# Patient Record
Sex: Female | Born: 1965 | Race: Black or African American | Hispanic: No | Marital: Single | Smoking: Never smoker
Health system: Southern US, Community
[De-identification: ages and names within clinical notes are randomized; demographics above are authoritative.]

## PROBLEM LIST (undated history)

## (undated) DIAGNOSIS — M329 Systemic lupus erythematosus, unspecified: Secondary | ICD-10-CM

## (undated) DIAGNOSIS — I10 Essential (primary) hypertension: Secondary | ICD-10-CM

## (undated) HISTORY — PX: UTERINE FIBROID SURGERY: SHX826

## (undated) HISTORY — DX: Essential (primary) hypertension: I10

## (undated) HISTORY — DX: Systemic lupus erythematosus, unspecified: M32.9

---

## 2001-10-17 ENCOUNTER — Encounter: Payer: Self-pay | Admitting: *Deleted

## 2001-10-17 ENCOUNTER — Emergency Department (HOSPITAL_COMMUNITY): Admission: EM | Admit: 2001-10-17 | Discharge: 2001-10-17 | Payer: Self-pay | Admitting: *Deleted

## 2003-04-16 ENCOUNTER — Inpatient Hospital Stay (HOSPITAL_COMMUNITY): Admission: AD | Admit: 2003-04-16 | Discharge: 2003-04-16 | Payer: Self-pay | Admitting: Obstetrics and Gynecology

## 2003-05-17 ENCOUNTER — Other Ambulatory Visit: Admission: RE | Admit: 2003-05-17 | Discharge: 2003-05-17 | Payer: Self-pay | Admitting: Obstetrics and Gynecology

## 2003-05-17 ENCOUNTER — Encounter: Admission: RE | Admit: 2003-05-17 | Discharge: 2003-05-17 | Payer: Self-pay | Admitting: Obstetrics and Gynecology

## 2003-06-18 ENCOUNTER — Ambulatory Visit (HOSPITAL_COMMUNITY): Admission: RE | Admit: 2003-06-18 | Discharge: 2003-06-18 | Payer: Self-pay | Admitting: Obstetrics and Gynecology

## 2003-07-03 ENCOUNTER — Encounter: Admission: RE | Admit: 2003-07-03 | Discharge: 2003-07-03 | Payer: Self-pay | Admitting: Obstetrics and Gynecology

## 2004-02-06 IMAGING — US US TRANSVAGINAL NON-OB
1 series · 13 of 25 positions shown · non-contrast
Comparison: none

CLINICAL DATA: Passage of large clot with abnormal vaginal bleeding.
PELVIC ULTRASOUND WITH TRANSVAGINAL EVALUATION, 04/16/03
Multiple images of the uterus and adnexa were obtained using a transabdominal and endovaginal approaches.  
The uterus has a maximal sagittal length of 10.7 cm and a maximal AP width of 6.0 cm.  A homogeneous uterine myometrium is seen.  The endometrial canal endovaginally measures 1.3 cm in maximal AP width.  The rim appears thin and echogenic with the internal portion of the endometrial canal being hypoechoic in nature, and I suspect that the endometrial lining itself may be normal with the canal filled with some blood, giving this distended endometrial appearance.  Evaluation of the endometrial canal is somewhat suboptimal due to the AP positioning of the uterus.  Once the patient is not actively bleeding, further assessment can be undertaken; and if necessary sonohysterography can be performed to evaluate the endometrial lining better. 
The right ovary measures 3.4 x 1.7 x 2.9 cm and has a normal appearance.   The left ovary measures 6.8 x 4.8 x 3.4 cm and contains two unilocular cysts measuring 3.6 x 3.0 x 3.8 cm and a second probable follicular cyst measuring 1.7 x 1.6 x 1.4 cm.   No cul-de-sac or periovarian fluid is seen.
IMPRESSION 
Normal uterine myometrium.
Somewhat suboptimal evaluation of the endometrial canal is possible due to the uterine positioning.  The study suggests a thin endometrial lining with a distended endometrial cavity possibly containing diffuse blood products.  Reassessment of the endometrial canal can be undertaken after the patient is no longer bleeding for hopeful improvement assessment of the endometrial lining.
Simple left ovarian cyst.  Follow-up is recommended in 4 to 6 weeks for reassessment of this suspected benign process.

[Series 1: unknown · 0.32mm/px · 13 of 73 slices shown]
[im 1/73]
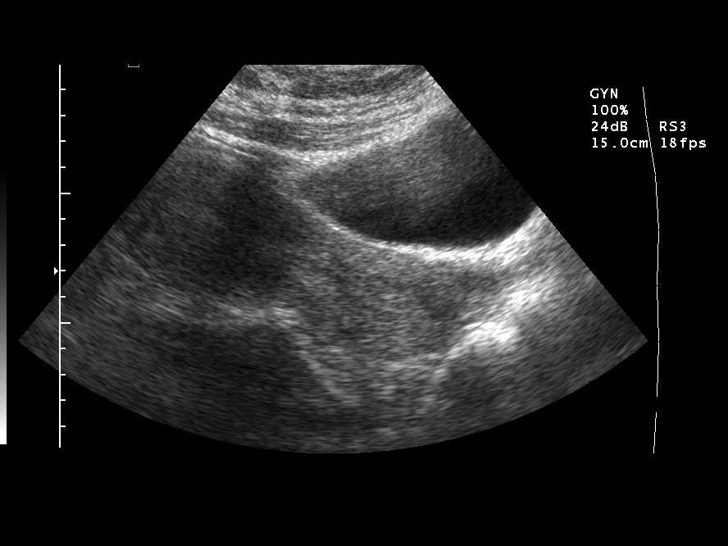
[im 7/73]
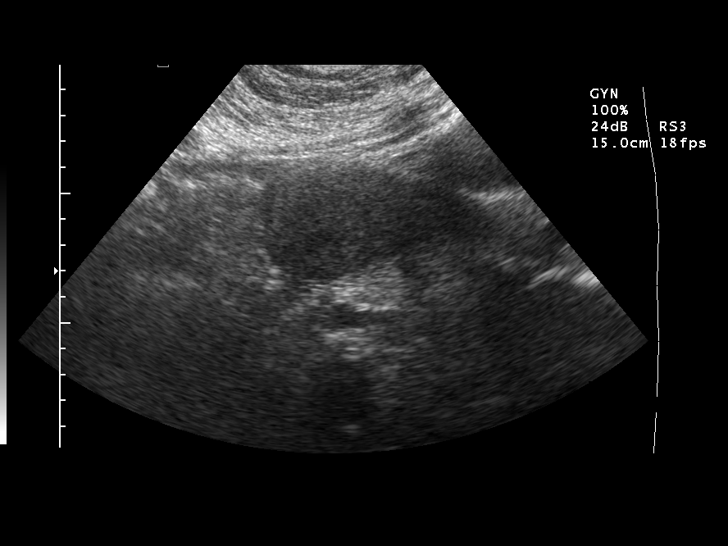
[im 13/73]
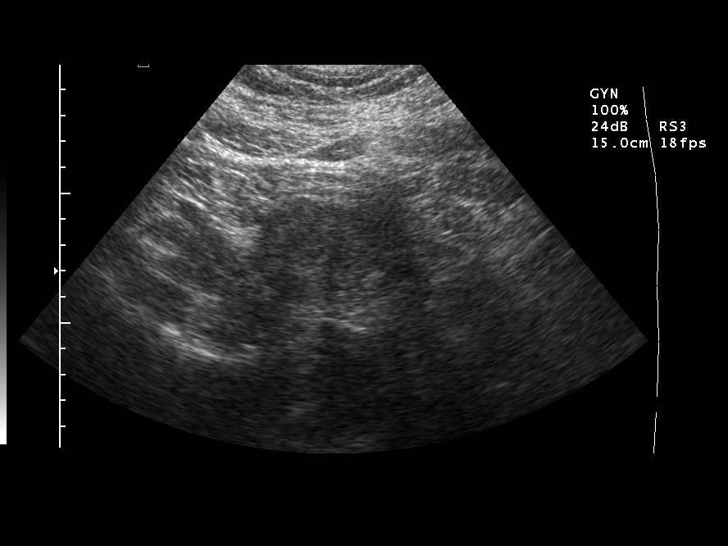
[im 19/73]
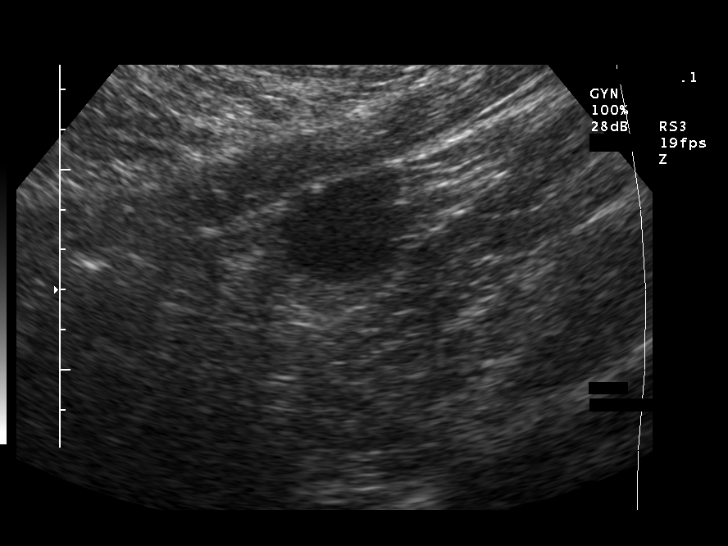
[im 25/73]
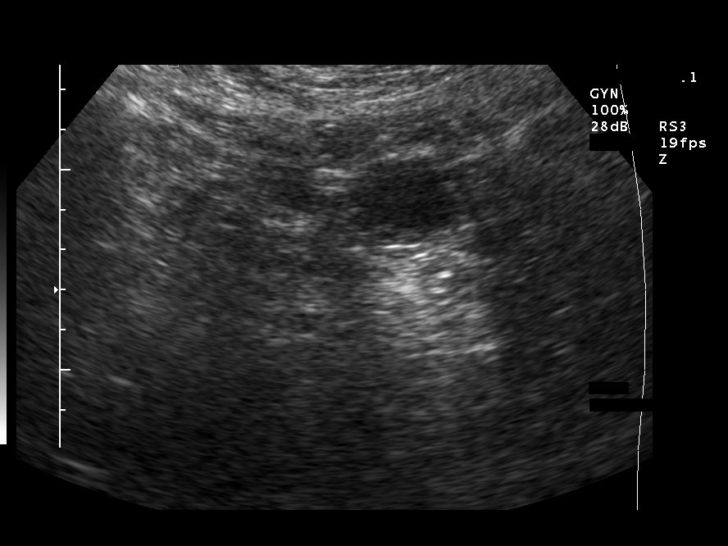
[im 31/73]
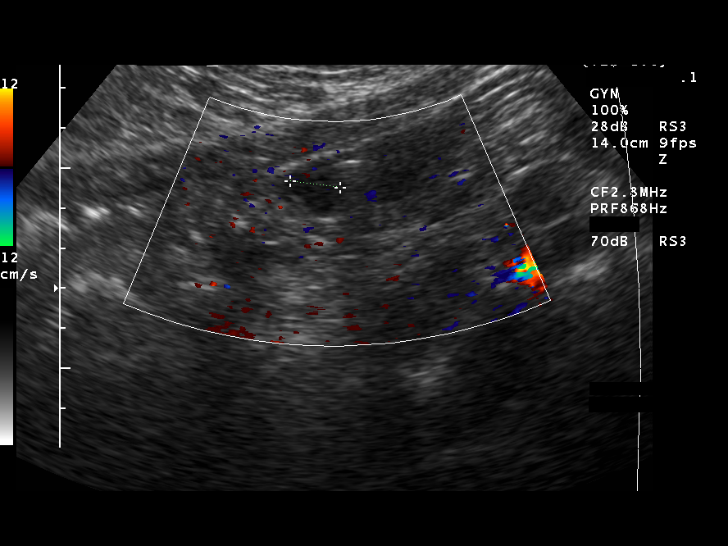
[im 37/73]
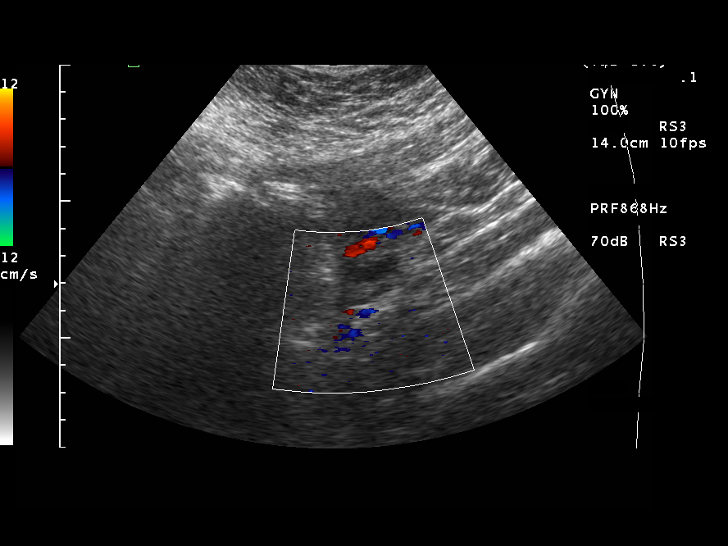
[im 43/73]
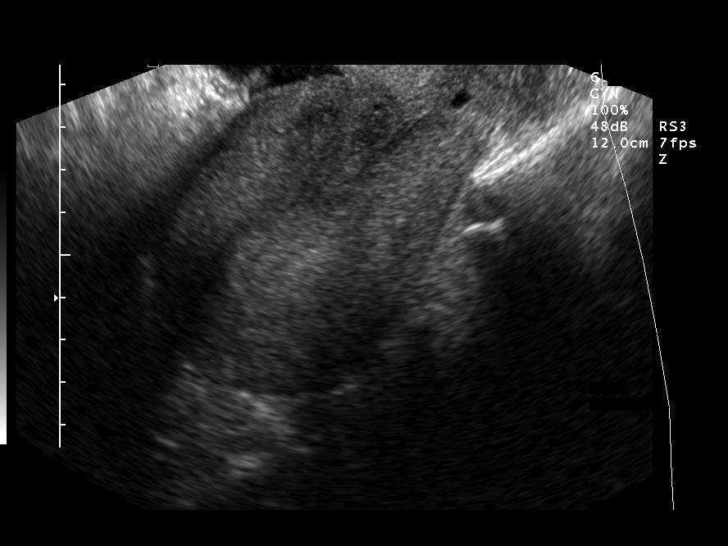
[im 49/73]
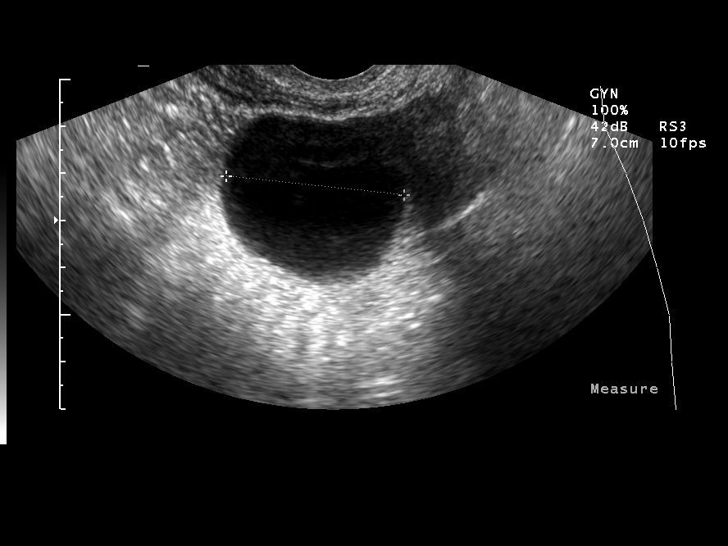
[im 55/73]
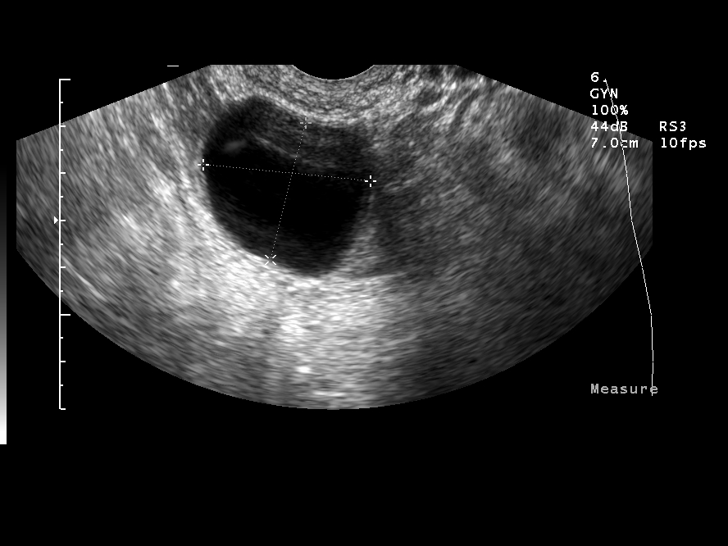
[im 61/73]
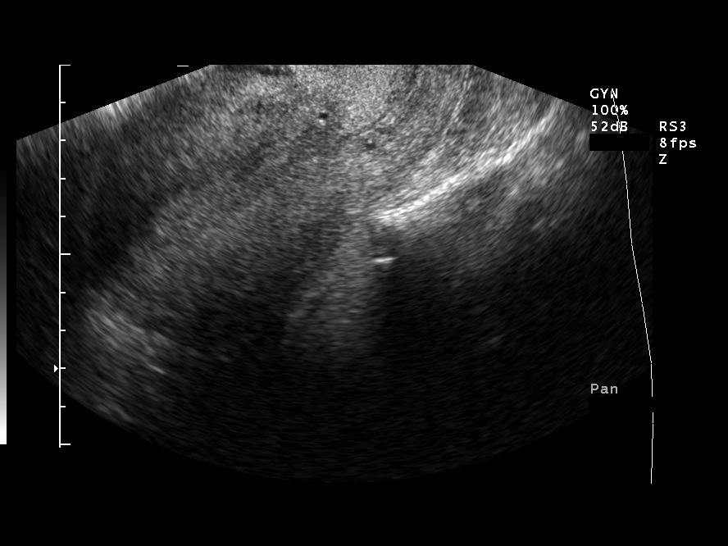
[im 67/73]
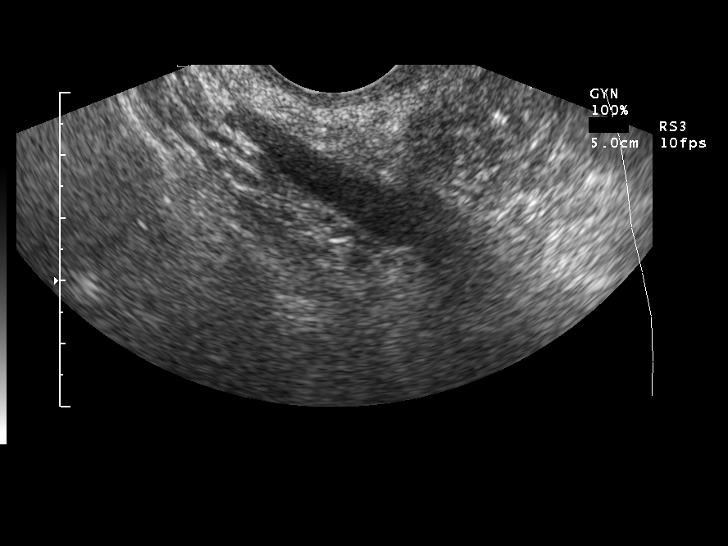
[im 73/73]
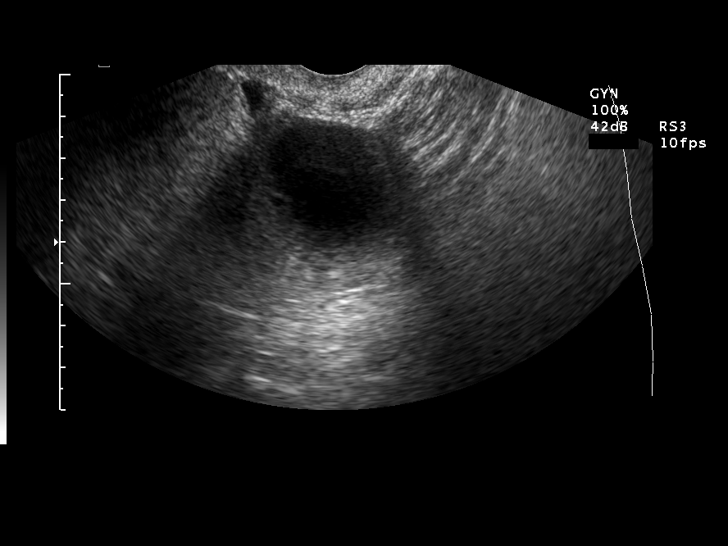

[13 of 25 positions shown; findings below may reference images not displayed]

## 2008-06-08 DIAGNOSIS — M329 Systemic lupus erythematosus, unspecified: Secondary | ICD-10-CM

## 2008-06-08 HISTORY — DX: Systemic lupus erythematosus, unspecified: M32.9

## 2011-08-02 ENCOUNTER — Ambulatory Visit: Payer: Self-pay | Admitting: Family Medicine

## 2011-08-02 DIAGNOSIS — I10 Essential (primary) hypertension: Secondary | ICD-10-CM

## 2011-08-02 DIAGNOSIS — R079 Chest pain, unspecified: Secondary | ICD-10-CM

## 2011-08-02 DIAGNOSIS — M329 Systemic lupus erythematosus, unspecified: Secondary | ICD-10-CM | POA: Insufficient documentation

## 2011-08-02 DIAGNOSIS — Z79899 Other long term (current) drug therapy: Secondary | ICD-10-CM

## 2011-08-02 DIAGNOSIS — IMO0002 Reserved for concepts with insufficient information to code with codable children: Secondary | ICD-10-CM

## 2011-08-02 DIAGNOSIS — E669 Obesity, unspecified: Secondary | ICD-10-CM

## 2011-08-02 LAB — POCT CBC
Granulocyte percent: 38.4 %G (ref 37–80)
HCT, POC: 36.9 % — AB (ref 37.7–47.9)
Hemoglobin: 11.7 g/dL — AB (ref 12.2–16.2)
Lymph, poc: 1.7 (ref 0.6–3.4)
MCH, POC: 29.4 pg (ref 27–31.2)
MCHC: 31.7 g/dL — AB (ref 31.8–35.4)
MCV: 92.7 fL (ref 80–97)
MID (cbc): 0.2 (ref 0–0.9)
MPV: 9.6 fL (ref 0–99.8)
POC Granulocyte: 1.2 — AB (ref 2–6.9)
POC LYMPH PERCENT: 54.8 %L — AB (ref 10–50)
POC MID %: 6.8 %M (ref 0–12)
Platelet Count, POC: 257 10*3/uL (ref 142–424)
RBC: 3.98 M/uL — AB (ref 4.04–5.48)
RDW, POC: 13.8 %
WBC: 3.1 10*3/uL — AB (ref 4.6–10.2)

## 2011-08-02 LAB — COMPREHENSIVE METABOLIC PANEL
ALT: 11 U/L (ref 0–35)
AST: 15 U/L (ref 0–37)
Albumin: 4.1 g/dL (ref 3.5–5.2)
Alkaline Phosphatase: 48 U/L (ref 39–117)
BUN: 14 mg/dL (ref 6–23)
CO2: 27 mEq/L (ref 19–32)
Calcium: 9.4 mg/dL (ref 8.4–10.5)
Chloride: 104 mEq/L (ref 96–112)
Creat: 0.92 mg/dL (ref 0.50–1.10)
Glucose, Bld: 87 mg/dL (ref 70–99)
Potassium: 4 mEq/L (ref 3.5–5.3)
Sodium: 140 mEq/L (ref 135–145)
Total Bilirubin: 0.4 mg/dL (ref 0.3–1.2)
Total Protein: 6.6 g/dL (ref 6.0–8.3)

## 2011-08-02 LAB — POCT SEDIMENTATION RATE: POCT SED RATE: 29 mm/hr — AB (ref 0–22)

## 2011-08-02 MED ORDER — AMLODIPINE BESYLATE 10 MG PO TABS: 10.0000 mg | ORAL_TABLET | Freq: Every day | ORAL | Status: DC

## 2011-08-02 NOTE — Progress Notes (Signed)
Patient Name: Kristie Henderson Date of Birth: 1965/09/18 Medical Record Number: [id number] Gender: female Date of Encounter: 08/02/2011  History of Present Illness:  Kristie Henderson is a 46 y.o. very pleasant female patient who presents with the following:  Pt of Dr. Corliss Skains who manages her lupus- she is on plaquenil.  Here today for labs.    Per patient her BP is usually between 140 -150/ 70- 90.  She takes both her BP pills in the am.  Added norvasc the last time she was seen at [facility] due to poor BP control No headaches.  However when questioned about CP she notes that she has been having a "charley horse" in her anterior substernal chest for the last 2 weeks, occurs every few days and lasts about 5 minutes. Last happened last week.  Can occur at rest or with activity.  She has a history of HTN and obesity, but has never smoked.  She does have a family history of CAD.  She did have a stress test done for concern of "angina" which was negative- but his was more than 20 years ago.   Patient Active Problem List  Diagnoses  . Lupus  . Hypertension  . Obesity   History reviewed. No pertinent past medical history. Past Surgical History  Procedure Date  . Cesarean section   . Uterine fibroid surgery    History  Substance Use Topics  . Smoking status: Never Smoker   . Smokeless tobacco: Never Used  . Alcohol Use: No   Family History  Problem Relation Age of Onset  . Stroke Maternal Grandmother   . Heart disease Maternal Grandfather   . Cancer Paternal Grandfather    No Known Allergies  Medication list has been reviewed and updated.  Review of Systems: As per HPI- otherwise feels ok.    Physical Examination: Filed Vitals:   08/02/11 0816  BP: 150/87  Pulse: 66  Temp: 98.3 F (36.8 C)  TempSrc: Oral  Resp: 16  Height: 5' 7.25" (1.708 m)  Weight: 275 lb 9.6 oz (125.011 kg)    Body mass index is 42.84 kg/(m^2).  GEN: WDWN, NAD, Non-toxic, A & O x 3, obese HEENT:  Atraumatic, Normocephalic. Neck supple. No masses, No LAD. Ears and Nose: No external deformity. CV: RRR, No M/G/R. No JVD. No thrill. No extra heart sounds. PULM: CTA B, no wheezes, crackles, rhonchi. No retractions. No resp. distress. No accessory muscle use. ABD: S, NT, ND, +BS. No rebound. No HSM. EXTR: No c/c/e NEURO Normal gait.  PSYCH: Normally interactive. Conversant. Not depressed or anxious appearing.  Calm demeanor.   Results for orders placed in visit on 08/02/11  POCT CBC      Component Value Range   WBC 3.1 (*) 4.6 - 10.2 (K/uL)   Lymph, poc 1.7  0.6 - 3.4    POC LYMPH PERCENT 54.8 (*) 10 - 50 (%L)   MID (cbc) 0.2  0 - 0.9    POC MID % 6.8  0 - 12 (%M)   POC Granulocyte 1.2 (*) 2 - 6.9    Granulocyte percent 38.4  37 - 80 (%G)   RBC 3.98 (*) 4.04 - 5.48 (M/uL)   Hemoglobin 11.7 (*) 12.2 - 16.2 (g/dL)   HCT, POC 40.9 (*) 81.1 - 47.9 (%)   MCV 92.7  80 - 97 (fL)   MCH, POC 29.4  27 - 31.2 (pg)   MCHC 31.7 (*) 31.8 - 35.4 (g/dL)   RDW, POC  13.8     Platelet Count, POC 257  142 - 424 (K/uL)   MPV 9.6  0 - 99.8 (fL)  POCT SEDIMENTATION RATE      Component Value Range   POCT SED RATE 29 (*) 0 - 22 (mm/hr)     EKG: NSR, no ST elevation or depression Assessment and Plan: 1. Lupus  POCT CBC, POCT SEDIMENTATION RATE, ANA, Comprehensive metabolic panel, C3 and C4  2. Hypertension  amLODipine (NORVASC) 10 MG tablet  3. Obesity    4. Chest pain      Labs done as ordered by Dr. Eustace Quail.  Discussed CP in detail with patient.  Although her EKG is reassuring, this does not rule out the possibility of anginal pain- she has risk factors for CAD and the pain she is having may be a warning sign which allows [place] to intercept a problem before it leads to serious consequences such as an MI.  encouraged her to have a cardiology evaluation.  However, at this time Ms. Orris does not feel that she has the time or money to see cardiology- I did discuss the possibility of having a heart  attack if she is having angina.  She declined to see cardiology now, but did agree to start a baby asa daily, and to seek care if this problem continues.    Also increased norvasc to 10mg  daily today due to still poorly controlled BP.  Further follow- up pending additional labs

## 2011-08-03 LAB — ANA: Anti Nuclear Antibody(ANA): POSITIVE — AB

## 2011-08-03 LAB — ANTI-NUCLEAR AB-TITER (ANA TITER): ANA Titer 1: NEGATIVE

## 2011-08-04 ENCOUNTER — Encounter: Payer: Self-pay | Admitting: Family Medicine

## 2011-08-04 LAB — C3 AND C4
C3 Complement: 188 mg/dL — ABNORMAL HIGH (ref 90–180)
C4 Complement: 42 mg/dL — ABNORMAL HIGH (ref 10–40)

## 2011-08-05 ENCOUNTER — Encounter: Payer: Self-pay | Admitting: Family Medicine

## 2011-09-19 ENCOUNTER — Ambulatory Visit: Payer: Self-pay | Admitting: Family Medicine

## 2011-09-19 VITALS — BP 110/73 | HR 78 | Temp 98.3°F | Resp 16 | Ht 67.0 in | Wt 275.0 lb

## 2011-09-19 DIAGNOSIS — E785 Hyperlipidemia, unspecified: Secondary | ICD-10-CM

## 2011-09-19 DIAGNOSIS — M329 Systemic lupus erythematosus, unspecified: Secondary | ICD-10-CM

## 2011-09-19 DIAGNOSIS — D72819 Decreased white blood cell count, unspecified: Secondary | ICD-10-CM

## 2011-09-19 LAB — POCT CBC
Granulocyte percent: 54.9 %G (ref 37–80)
HCT, POC: 35.9 % — AB (ref 37.7–47.9)
Hemoglobin: 11.5 g/dL — AB (ref 12.2–16.2)
Lymph, poc: 2 (ref 0.6–3.4)
MCH, POC: 29.6 pg (ref 27–31.2)
MCHC: 32 g/dL (ref 31.8–35.4)
MCV: 92.6 fL (ref 80–97)
MID (cbc): 0.4 (ref 0–0.9)
MPV: 8.7 fL (ref 0–99.8)
POC Granulocyte: 3 (ref 2–6.9)
POC LYMPH PERCENT: 37.2 %L (ref 10–50)
POC MID %: 7.9 %M (ref 0–12)
Platelet Count, POC: 309 10*3/uL (ref 142–424)
RBC: 3.88 M/uL — AB (ref 4.04–5.48)
RDW, POC: 13.5 %
WBC: 5.5 10*3/uL (ref 4.6–10.2)

## 2011-09-19 NOTE — Progress Notes (Signed)
  Subjective:    Patient ID: Kristie Henderson, female    DOB: 08-May-1966, 45 y.o.   MRN: [id number]  HPI  Patient presents with request for CBC; presents order from Dr. Corliss Skains  Recent leukopenia; denies fever or other issues related to systemic illness  Also needs handicap parking tag paper work completed.  Provides previous paperwork completed at Dr. Fatima Sanger office.  Review of Systems     Objective:   Physical Exam  Constitutional: She appears well-developed and well-nourished.  Neck: Neck supple. No thyromegaly present.  Cardiovascular: Normal rate, regular rhythm and normal heart sounds.   Pulmonary/Chest: Effort normal and breath sounds normal.  Abdominal: Soft. Bowel sounds are normal.  Neurological: She is alert.  Skin:       Rare ecchymosis on LE     Results for orders placed in visit on 09/19/11  POCT CBC      Component Value Range   WBC 5.5  4.6 - 10.2 (K/uL)   Lymph, poc 2.0  0.6 - 3.4    POC LYMPH PERCENT 37.2  10 - 50 (%L)   MID (cbc) 0.4  0 - 0.9    POC MID % 7.9  0 - 12 (%M)   POC Granulocyte 3.0  2 - 6.9    Granulocyte percent 54.9  37 - 80 (%G)   RBC 3.88 (*) 4.04 - 5.48 (M/uL)   Hemoglobin 11.5 (*) 12.2 - 16.2 (g/dL)   HCT, POC 53.6 (*) 64.4 - 47.9 (%)   MCV 92.6  80 - 97 (fL)   MCH, POC 29.6  27 - 31.2 (pg)   MCHC 32.0  31.8 - 35.4 (g/dL)   RDW, POC 03.4     Platelet Count, POC 309  142 - 424 (K/uL)   MPV 8.7  0 - 99.8 (fL)        Assessment & Plan:   1. SLE (systemic lupus erythematosus)    2. Leucopenia  POCT CBC    Paperwork for handicap parking tag completed

## 2011-11-14 ENCOUNTER — Telehealth: Payer: Self-pay

## 2011-11-14 NOTE — Telephone Encounter (Signed)
I see form is completed already and in p/up drawer.  Darl Pikes already notified patient.

## 2011-11-14 NOTE — Telephone Encounter (Signed)
Pt dropped off a form today that she had dropped off in April but we apparently lost it.  Can someone please complete the form and call her when ready.  She is in [place] today and could pick it up later today.  I would appreciate your help if we Can get it for her today.    Thanks  Darl Pikes

## 2011-12-25 ENCOUNTER — Telehealth: Payer: Self-pay

## 2011-12-25 NOTE — Telephone Encounter (Signed)
Lisa at Dr. Kandra Nicolas office called to request most recent labs on patient, specifically CBC results.  The patient is in the office now.  Please call [phone] with any questions.  The fax # for office is [phone].

## 2011-12-25 NOTE — Telephone Encounter (Signed)
Requested labs faxed from April visit via Epic to Dr Corliss Skains at [phone].

## 2012-01-30 ENCOUNTER — Ambulatory Visit: Payer: Self-pay | Admitting: Family Medicine

## 2012-01-30 VITALS — BP 139/83 | HR 80 | Temp 98.3°F | Resp 16 | Ht 67.5 in | Wt 280.0 lb

## 2012-01-30 DIAGNOSIS — I10 Essential (primary) hypertension: Secondary | ICD-10-CM

## 2012-01-30 DIAGNOSIS — R079 Chest pain, unspecified: Secondary | ICD-10-CM

## 2012-01-30 DIAGNOSIS — D72819 Decreased white blood cell count, unspecified: Secondary | ICD-10-CM

## 2012-01-30 DIAGNOSIS — M329 Systemic lupus erythematosus, unspecified: Secondary | ICD-10-CM

## 2012-01-30 LAB — COMPREHENSIVE METABOLIC PANEL
ALT: 14 U/L (ref 0–35)
AST: 16 U/L (ref 0–37)
Albumin: 4.1 g/dL (ref 3.5–5.2)
Alkaline Phosphatase: 48 U/L (ref 39–117)
BUN: 9 mg/dL (ref 6–23)
CO2: 31 mEq/L (ref 19–32)
Calcium: 9.4 mg/dL (ref 8.4–10.5)
Chloride: 101 mEq/L (ref 96–112)
Creat: 0.85 mg/dL (ref 0.50–1.10)
Glucose, Bld: 77 mg/dL (ref 70–99)
Potassium: 4.2 mEq/L (ref 3.5–5.3)
Sodium: 138 mEq/L (ref 135–145)
Total Bilirubin: 0.4 mg/dL (ref 0.3–1.2)
Total Protein: 7.2 g/dL (ref 6.0–8.3)

## 2012-01-30 LAB — CBC WITH DIFFERENTIAL/PLATELET
Basophils Absolute: 0 10*3/uL (ref 0.0–0.1)
Basophils Relative: 1 % (ref 0–1)
Eosinophils Absolute: 0.1 10*3/uL (ref 0.0–0.7)
Eosinophils Relative: 2 % (ref 0–5)
HCT: 35.2 % — ABNORMAL LOW (ref 36.0–46.0)
Hemoglobin: 12.1 g/dL (ref 12.0–15.0)
Lymphocytes Relative: 45 % (ref 12–46)
Lymphs Abs: 1.6 10*3/uL (ref 0.7–4.0)
MCH: 30 pg (ref 26.0–34.0)
MCHC: 34.4 g/dL (ref 30.0–36.0)
MCV: 87.1 fL (ref 78.0–100.0)
Monocytes Absolute: 0.3 10*3/uL (ref 0.1–1.0)
Monocytes Relative: 8 % (ref 3–12)
Neutro Abs: 1.6 10*3/uL — ABNORMAL LOW (ref 1.7–7.7)
Neutrophils Relative %: 44 % (ref 43–77)
Platelets: 324 10*3/uL (ref 150–400)
RBC: 4.04 MIL/uL (ref 3.87–5.11)
RDW: 13.6 % (ref 11.5–15.5)
WBC: 3.7 10*3/uL — ABNORMAL LOW (ref 4.0–10.5)

## 2012-01-30 LAB — POCT URINALYSIS DIPSTICK
Bilirubin, UA: NEGATIVE
Glucose, UA: NEGATIVE
Ketones, UA: NEGATIVE
Leukocytes, UA: NEGATIVE
Nitrite, UA: NEGATIVE
Protein, UA: 30
Spec Grav, UA: 1.025
Urobilinogen, UA: 0.2
pH, UA: 7

## 2012-01-30 MED ORDER — LOSARTAN POTASSIUM-HCTZ 100-12.5 MG PO TABS: 1.0000 | ORAL_TABLET | Freq: Every day | ORAL | Status: DC

## 2012-01-30 NOTE — Patient Instructions (Addendum)
1. Hypertension  Comprehensive metabolic panel, POCT urinalysis dipstick, losartan-hydrochlorothiazide (HYZAAR) 100-12.5 MG per tablet  2. SLE (systemic lupus erythematosus related syndrome)  CBC with Differential  3. Leukocytopenia  CBC with Differential  4. Chest pain  Ambulatory referral to Cardiology

## 2012-01-30 NOTE — Progress Notes (Signed)
Subjective:    Patient ID: Kristie Henderson, female    DOB: [birth year], 46 y.o.   MRN: [id number]  HPIThis 46 y.o. female presents for evaluation of the following:  1. HTN:  Six month follow-up for HTN.  Management at last visit included increasing dose of  Amlodipine to 10mg  daily.  No side effects to medication.  Home BP running 120/80-150/90.  +chest pain, no palpitations, no SOB, +leg swelling.  Interested in diuretic due to swelling.   2.  Chest pain: six month follow-up.   Still having chest pain once weekly; can occur at rest, with exertion, randomly happen.  Feels like elephant sitting on chest; mild SOB, no diaphoresis, +dizziness, no nausea, vomiting.  Duration 10 minutes.  Now agreeable to cardiology referral; started taking ASA daily after last visit.  3.  SLE:  Chronic since 19-Nov-2003.  Followed by Deveschwar.  Needs CBC, CMET.  Followed by Deveschwar every three months.  No change in medications other than 2 to 1 of Plaquenil due to low WBC; now back up to 2.  May warrant different medication due to persistent leukocytopenia.  PMH:  HTN age 54, SLE age 80, Irregular Menses. Psurg:  C-section x 1. twins All:  LATEX Social: divorced; dating; lives with son.  5 children (adopted 1, 32, 28, 23, 23, 21), 9 grandchildren.  Works Midwife.  No tobacco/ETOH/drugs.  Rare exercise.  Working on Education administrator at [employer].  Family:  M13-Jun-1974, seizure disorder.  F-died lung cancer age 61;    Sibling:  None.    Review of Systems  Constitutional: Negative for fever, chills and fatigue.  Respiratory: Positive for chest tightness. Negative for cough, shortness of breath and wheezing.   Cardiovascular: Positive for chest pain and leg swelling. Negative for palpitations.  Gastrointestinal: Negative for nausea and abdominal pain.  Neurological: Negative for dizziness, tremors, syncope, weakness, light-headedness, numbness and headaches.    No past medical history on file.  Past Surgical  History  Procedure Date  . Cesarean section   . Uterine fibroid surgery     Prior to Admission medications   Medication Sig Start Date End Date Taking? Authorizing Provider  amLODipine (NORVASC) 10 MG tablet Take 1 tablet (10 mg total) by mouth daily. 08/02/11 08/01/12 Yes Gwenlyn Found Copland, MD  bisoprolol-hydrochlorothiazide (ZIAC) 10-6.25 MG per tablet Take 1 tablet by mouth daily.   Yes Historical Provider, MD  hydroxychloroquine (PLAQUENIL) 200 MG tablet Take 200 mg by mouth 2 (two) times daily.   Yes Historical Provider, MD  ibuprofen (ADVIL,MOTRIN) 200 MG tablet Take 200 mg by mouth 2 (two) times daily.   Yes Historical Provider, MD  NAPROXEN PO Take by mouth.   Yes Historical Provider, MD  losartan-hydrochlorothiazide (HYZAAR) 100-12.5 MG per tablet Take 1 tablet by mouth daily. 01/30/12 01/29/13  Ethelda Chick, MD    No Known Allergies  History   Social History  . Marital Status: Single    Spouse Name: N/A    Number of Children: N/A  . Years of Education: N/A   Occupational History  . Not on file.   Social History Main Topics  . Smoking status: Never Smoker   . Smokeless tobacco: Never Used  . Alcohol Use: No  . Drug Use: No  . Sexually Active: Not on file   Other Topics Concern  . Not on file   Social History Narrative  . No narrative on file    Family History  Problem Relation Age of  Onset  . Stroke Maternal Grandmother   . Heart disease Maternal Grandfather   . Cancer Paternal Grandfather        Objective:   Physical Exam  Constitutional: She is oriented to person, place, and time. She appears well-developed and well-nourished. No distress.  HENT:  Head: Normocephalic and atraumatic.  Eyes: Conjunctivae are normal. Pupils are equal, round, and reactive to light.  Neck: Normal range of motion. Neck supple. No JVD present. No thyromegaly present.  Cardiovascular: Normal rate, regular rhythm, normal heart sounds and intact distal pulses.   No murmur  heard.      Trace pitting edema BLE.  Pulmonary/Chest: Effort normal and breath sounds normal. No respiratory distress. She has no wheezes. She has no rales. She exhibits no tenderness.  Abdominal: Soft. Bowel sounds are normal.  Lymphadenopathy:    She has no cervical adenopathy.  Neurological: She is alert and oriented to person, place, and time.  Skin: Skin is warm. Rash noted. She is not diaphoretic.  Psychiatric: She has a normal mood and affect. Her behavior is normal. Judgment and thought content normal.      Results for orders placed in visit on 01/30/12  POCT URINALYSIS DIPSTICK      Component Value Range   Color, UA yellow     Clarity, UA cloudy     Glucose, UA negative     Bilirubin, UA negative     Ketones, UA negative     Spec Grav, UA 1.025     Blood, UA large     pH, UA 7.0     Protein, UA 30     Urobilinogen, UA 0.2     Nitrite, UA negative     Leukocytes, UA Negative      Assessment & Plan:   1. Hypertension  Comprehensive metabolic panel, POCT urinalysis dipstick, losartan-hydrochlorothiazide (HYZAAR) 100-12.5 MG per tablet  2. SLE (systemic lupus erythematosus related syndrome)  CBC with Differential  3. Leukocytopenia  CBC with Differential  4. Chest pain  Ambulatory referral to Cardiology     1.  HTN: improved; will change Losartan 100mg  to Losartan 100/12.5 one daily.  Obtain labs.   2.  SLE:  Stable; followed by rheumatology every three months; obtain labs per rheumatology order. 3.  Leukocytopenia: persistent issue; repeat labs today.  May warrant switch of Plaquenil to different agent. 4.  Chest Pain: persistent.  Now agreeable to cardiology referral. Will make referral.  To ED for severe chest pain.

## 2012-02-03 NOTE — Progress Notes (Signed)
Reviewed and agree.

## 2012-07-09 ENCOUNTER — Other Ambulatory Visit: Payer: Self-pay | Admitting: Family Medicine

## 2012-08-06 ENCOUNTER — Ambulatory Visit: Payer: Self-pay | Admitting: Family Medicine

## 2012-08-06 VITALS — BP 158/105 | HR 91 | Temp 98.1°F | Resp 18 | Ht 67.0 in | Wt 285.0 lb

## 2012-08-06 DIAGNOSIS — M329 Systemic lupus erythematosus, unspecified: Secondary | ICD-10-CM

## 2012-08-06 DIAGNOSIS — I10 Essential (primary) hypertension: Secondary | ICD-10-CM

## 2012-08-06 LAB — POCT CBC
Granulocyte percent: 47.4 %G (ref 37–80)
HCT, POC: 34.8 % — AB (ref 37.7–47.9)
Hemoglobin: 10.9 g/dL — AB (ref 12.2–16.2)
Lymph, poc: 2.1 (ref 0.6–3.4)
MCH, POC: 27.9 pg (ref 27–31.2)
MCHC: 31.3 g/dL — AB (ref 31.8–35.4)
MCV: 89.3 fL (ref 80–97)
MID (cbc): 0.3 (ref 0–0.9)
MPV: 8.6 fL (ref 0–99.8)
POC Granulocyte: 2.2 (ref 2–6.9)
POC LYMPH PERCENT: 45.4 %L (ref 10–50)
POC MID %: 7.2 %M (ref 0–12)
Platelet Count, POC: 310 10*3/uL (ref 142–424)
RBC: 3.9 M/uL — AB (ref 4.04–5.48)
RDW, POC: 14.2 %
WBC: 4.6 10*3/uL (ref 4.6–10.2)

## 2012-08-06 LAB — POCT UA - MICROSCOPIC ONLY
Bacteria, U Microscopic: NEGATIVE
Casts, Ur, LPF, POC: NEGATIVE
Crystals, Ur, HPF, POC: NEGATIVE
Mucus, UA: NEGATIVE
RBC, urine, microscopic: NEGATIVE
Yeast, UA: NEGATIVE

## 2012-08-06 LAB — POCT URINALYSIS DIPSTICK
Bilirubin, UA: NEGATIVE
Blood, UA: NEGATIVE
Glucose, UA: NEGATIVE
Ketones, UA: NEGATIVE
Leukocytes, UA: NEGATIVE
Nitrite, UA: NEGATIVE
Protein, UA: NEGATIVE
Spec Grav, UA: 1.02
Urobilinogen, UA: 0.2
pH, UA: 8

## 2012-08-06 LAB — POCT SEDIMENTATION RATE: POCT SED RATE: 30 mm/hr — AB (ref 0–22)

## 2012-08-06 MED ORDER — LOSARTAN POTASSIUM-HCTZ 100-12.5 MG PO TABS: 1.0000 | ORAL_TABLET | Freq: Every day | ORAL | Status: DC

## 2012-08-06 MED ORDER — AMLODIPINE BESYLATE 10 MG PO TABS: 10.0000 mg | ORAL_TABLET | Freq: Every day | ORAL | Status: DC

## 2012-08-06 MED ORDER — BISOPROLOL-HYDROCHLOROTHIAZIDE 10-6.25 MG PO TABS: ORAL_TABLET | ORAL | Status: DC

## 2012-08-06 NOTE — Patient Instructions (Addendum)
Keep an eye on your blood pressure- it if does not come back to normal with the addition of your ziac medication please let us know. I will send a copy of your labs to you and to Dr. Algis Downs.

## 2012-08-06 NOTE — Progress Notes (Signed)
[facility] [address] [place] [address] [id number] [phone]  Date:  08/06/2012   Name:  Kristie Henderson   DOB:  [birth year]   MRN:  [id number]  PCP:  No primary provider on file.    Chief Complaint: Medication Refill   History of Present Illness:  Kristie Henderson is a 47 y.o. very pleasant female patient who presents with the following:  She is here for a medicaiton refill and also for labs per Dr Corliss Skains who treats her for lupus.   She is taking amlodipine, ziac and hyzaar for her BP.  She is out of her ziac- has been out for about one week.   She has been aware that her pressure is up- she has noted that she felt a little bit "woozy"   LMP was a few months ago- she had a fibroid surgery in 2008 and has had only occasional menses since then, she was told that she would be unlikely to get pregnant.    Patient Active Problem List  Diagnosis  . Lupus  . Hypertension  . Obesity    No past medical history on file.  Past Surgical History  Procedure Laterality Date  . Cesarean section    . Uterine fibroid surgery      History  Substance Use Topics  . Smoking status: Never Smoker   . Smokeless tobacco: Never Used  . Alcohol Use: No    Family History  Problem Relation Age of Onset  . Stroke Maternal Grandmother   . Heart disease Maternal Grandfather   . Cancer Paternal Grandfather     No Known Allergies  Medication list has been reviewed and updated.  Current Outpatient Prescriptions on File Prior to Visit  Medication Sig Dispense Refill  . bisoprolol-hydrochlorothiazide (ZIAC) 10-6.25 MG per tablet TAKE 1 TABLET BY MOUTH DAILY  90 tablet  3  . hydroxychloroquine (PLAQUENIL) 200 MG tablet Take 200 mg by mouth 2 (two) times daily.      Marland Kitchen losartan-hydrochlorothiazide (HYZAAR) 100-12.5 MG per tablet Take 1 tablet by mouth daily.  90 tablet  1  . NAPROXEN PO Take by mouth.      Marland Kitchen amLODipine (NORVASC) 10 MG tablet Take 1 tablet (10 mg total) by  mouth daily.  90 tablet  3  . ibuprofen (ADVIL,MOTRIN) 200 MG tablet Take 200 mg by mouth 2 (two) times daily.       No current facility-administered medications on file prior to visit.    Review of Systems:  As per HPI- otherwise negative.   Physical Examination: Filed Vitals:   08/06/12 1220  BP: 158/105  Pulse: 91  Temp: 98.1 F (36.7 C)  Resp: 18   Filed Vitals:   08/06/12 1220  Height: 5\' 7"  (1.702 m)  Weight: 285 lb (129.275 kg)   Body mass index is 44.63 kg/(m^2). Ideal Body Weight: Weight in (lb) to have BMI = 25: 159.3  GEN: WDWN, NAD, Non-toxic, A & O x 3, obese HEENT: Atraumatic, Normocephalic. Neck supple. No masses, No LAD.  PEERL, EOMI Ears and Nose: No external deformity. CV: RRR, No M/G/R. No JVD. No thrill. No extra heart sounds. PULM: CTA B, no wheezes, crackles, rhonchi. No retractions. No resp. distress. No accessory muscle use. EXTR: No c/c/e NEURO Normal gait.  PSYCH: Normally interactive. Conversant. Not depressed or anxious appearing.  Calm demeanor.    Assessment and Plan: Hypertension - Plan: amLODipine (NORVASC) 10 MG tablet, bisoprolol-hydrochlorothiazide (ZIAC) 10-6.25 MG per  tablet, losartan-hydrochlorothiazide (HYZAAR) 100-12.5 MG per tablet  Lupus - Plan: ANA, POCT SEDIMENTATION RATE, POCT UA - Microscopic Only, POCT urinalysis dipstick, POCT CBC  Refilled HTN medications today. Did labs per rheumatology and a CBC Will plan further follow- up pending labs.  See patient instructions for more details.     Lanaiya Lantry, MD  Send copy of labs to pt and to Dr. Corliss Skains

## 2012-08-08 LAB — ANA: Anti Nuclear Antibody(ANA): NEGATIVE

## 2012-08-09 ENCOUNTER — Encounter: Payer: Self-pay | Admitting: Family Medicine

## 2012-10-15 ENCOUNTER — Other Ambulatory Visit: Payer: Self-pay | Admitting: Physician Assistant

## 2012-10-15 ENCOUNTER — Ambulatory Visit: Payer: Self-pay | Admitting: Family Medicine

## 2012-10-15 VITALS — BP 126/90 | HR 77 | Temp 98.2°F | Resp 18 | Wt 287.0 lb

## 2012-10-15 DIAGNOSIS — M25569 Pain in unspecified knee: Secondary | ICD-10-CM

## 2012-10-15 DIAGNOSIS — M2550 Pain in unspecified joint: Secondary | ICD-10-CM

## 2012-10-15 DIAGNOSIS — M329 Systemic lupus erythematosus, unspecified: Secondary | ICD-10-CM

## 2012-10-15 DIAGNOSIS — Z79899 Other long term (current) drug therapy: Secondary | ICD-10-CM

## 2012-10-15 LAB — POCT SEDIMENTATION RATE: POCT SED RATE: 23 mm/hr — AB (ref 0–22)

## 2012-10-15 LAB — TSH: TSH: 1.548 u[IU]/mL (ref 0.350–4.500)

## 2012-10-15 LAB — T4, FREE: Free T4: 1 ng/dL (ref 0.80–1.80)

## 2012-10-15 NOTE — Progress Notes (Signed)
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 She recently had a flare requiring 6 months of prednisone during which time she 36 pounds. Now she is off the prednisone and is feeling fine.  Objective: Mild facial erythema on cheeks, no acute distress, obese HEENT: Unremarkable Neck: Supple no adenopathy Chest: Clear Heart: Regular no murmur Extremities: Trace edema Skin: Other than the plethoric cheeks, there is no rash  Assessment: Lupus, quiet and at the moment  Plan: Lab testing per Dr. Corliss Skains: Pain in joint, multiple sites - Plan: Vitamin D, 25-hydroxy, C3 and C4  Lupus (systemic lupus erythematosus) - Plan: C3 and C4, Systemic Lupus Profile B, TSH, T4, Free, POCT SEDIMENTATION RATE  High risk medication use - Plan: Vitamin D, 25-hydroxy, TSH, T4, Free    Signed,  Elvina Sidle, MD

## 2012-10-17 LAB — EXTRACTABLE NUCLEAR ANTIGEN ANTIBODY
ENA SM Ab Ser-aCnc: 1 AU/mL (ref <30)
SM/RNP: <1 AU/mL (ref <30)
SSA (Ro) (ENA) Antibody, IgG: 2 AU/mL (ref <30)
SSB (La) (ENA) Antibody, IgG: <1 AU/mL (ref <30)
Scleroderma (Scl-70) (ENA) Antibody, IgG: 42 AU/mL — ABNORMAL HIGH (ref <30)
ds DNA Ab: 4 IU/mL (ref <30)

## 2012-10-17 LAB — C3 AND C4
C3 Complement: 198 mg/dL — ABNORMAL HIGH (ref 90–180)
C4 Complement: 57 mg/dL — ABNORMAL HIGH (ref 10–40)

## 2012-10-17 LAB — VITAMIN D 25 HYDROXY (VIT D DEFICIENCY, FRACTURES): Vit D, 25-Hydroxy: <10 ng/mL — ABNORMAL LOW (ref 30–89)

## 2012-10-18 ENCOUNTER — Other Ambulatory Visit: Payer: Self-pay | Admitting: Family Medicine

## 2012-10-18 DIAGNOSIS — E559 Vitamin D deficiency, unspecified: Secondary | ICD-10-CM

## 2012-10-18 MED ORDER — CHOLECALCIFEROL 1.25 MG (50000 UT) PO TABS: 1.0000 | ORAL_TABLET | ORAL | Status: AC

## 2012-10-24 ENCOUNTER — Telehealth: Payer: Self-pay

## 2012-10-24 MED ORDER — ERGOCALCIFEROL 1.25 MG (50000 UT) PO CAPS: 50000.0000 [IU] | ORAL_CAPSULE | ORAL | Status: AC

## 2012-10-24 NOTE — Telephone Encounter (Signed)
Pharmacy called to clarify Rx sent 10/18/12. The Rx ordered does not contain Vit D and is for the wrong # for twice a week dosing. Checked notes under Labs and Dr L did intend to order Vit D. I will send in new Rx for this w/correct #.

## 2013-03-02 DIAGNOSIS — M329 Systemic lupus erythematosus, unspecified: Secondary | ICD-10-CM

## 2013-03-11 ENCOUNTER — Ambulatory Visit: Payer: Self-pay | Admitting: Physician Assistant

## 2013-03-11 VITALS — BP 132/78 | HR 82 | Temp 98.5°F | Resp 18 | Ht 67.0 in | Wt 280.2 lb

## 2013-03-11 DIAGNOSIS — M2550 Pain in unspecified joint: Secondary | ICD-10-CM

## 2013-03-11 DIAGNOSIS — R252 Cramp and spasm: Secondary | ICD-10-CM

## 2013-03-11 DIAGNOSIS — R5381 Other malaise: Secondary | ICD-10-CM

## 2013-03-11 DIAGNOSIS — Z79899 Other long term (current) drug therapy: Secondary | ICD-10-CM

## 2013-03-11 LAB — COMPREHENSIVE METABOLIC PANEL
ALT: 14 U/L (ref 0–35)
AST: 15 U/L (ref 0–37)
Albumin: 3.6 g/dL (ref 3.5–5.2)
Alkaline Phosphatase: 45 U/L (ref 39–117)
BUN: 9 mg/dL (ref 6–23)
CO2: 28 mEq/L (ref 19–32)
Calcium: 9.2 mg/dL (ref 8.4–10.5)
Chloride: 103 mEq/L (ref 96–112)
Creat: 0.89 mg/dL (ref 0.50–1.10)
Glucose, Bld: 102 mg/dL — ABNORMAL HIGH (ref 70–99)
Potassium: 3.9 mEq/L (ref 3.5–5.3)
Sodium: 139 mEq/L (ref 135–145)
Total Bilirubin: 0.6 mg/dL (ref 0.3–1.2)
Total Protein: 6.5 g/dL (ref 6.0–8.3)

## 2013-03-11 LAB — CBC WITH DIFFERENTIAL/PLATELET
Basophils Absolute: 0 10*3/uL (ref 0.0–0.1)
Basophils Relative: 1 % (ref 0–1)
Eosinophils Absolute: 0.1 10*3/uL (ref 0.0–0.7)
Eosinophils Relative: 2 % (ref 0–5)
HCT: 31.5 % — ABNORMAL LOW (ref 36.0–46.0)
Hemoglobin: 10.5 g/dL — ABNORMAL LOW (ref 12.0–15.0)
Lymphocytes Relative: 43 % (ref 12–46)
Lymphs Abs: 1.4 10*3/uL (ref 0.7–4.0)
MCH: 28.4 pg (ref 26.0–34.0)
MCHC: 33.3 g/dL (ref 30.0–36.0)
MCV: 85.1 fL (ref 78.0–100.0)
Monocytes Absolute: 0.2 10*3/uL (ref 0.1–1.0)
Monocytes Relative: 6 % (ref 3–12)
Neutro Abs: 1.6 10*3/uL — ABNORMAL LOW (ref 1.7–7.7)
Neutrophils Relative %: 48 % (ref 43–77)
Platelets: 326 10*3/uL (ref 150–400)
RBC: 3.7 MIL/uL — ABNORMAL LOW (ref 3.87–5.11)
RDW: 15.2 % (ref 11.5–15.5)
WBC: 3.3 10*3/uL — ABNORMAL LOW (ref 4.0–10.5)

## 2013-03-11 LAB — PHOSPHORUS: Phosphorus: 2.9 mg/dL (ref 2.3–4.6)

## 2013-03-11 LAB — MAGNESIUM: Magnesium: 1.7 mg/dL (ref 1.5–2.5)

## 2013-03-11 LAB — SEDIMENTATION RATE: Sed Rate: 18 mm/hr (ref 0–22)

## 2013-03-11 NOTE — Progress Notes (Signed)
  Subjective:    Patient ID: Kristie Henderson, female    DOB: 05-01-1966, 47 y.o.   MRN: [id number]  HPI 47 year old female presents for labwork.  She sees Dr. Corliss Skains regularly who has provided orders for labs today.  She lives in [place], [place] and drives here q32months for recheck with her.  Comes here for labs since Dr. Milus Glazier is her PCP.   Patient is otherwise doing well with no other concerns today.     Review of Systems  Constitutional: Negative for fever and chills.  Musculoskeletal: Positive for myalgias and arthralgias.  Neurological: Negative for dizziness and headaches.       Objective:   Physical Exam  Constitutional: She is oriented to person, place, and time. She appears well-developed and well-nourished.  HENT:  Head: Normocephalic and atraumatic.  Right Ear: External ear normal.  Left Ear: External ear normal.  Eyes: Conjunctivae are normal.  Neck: Normal range of motion.  Cardiovascular: Normal rate, regular rhythm and normal heart sounds.   Pulmonary/Chest: Effort normal and breath sounds normal.  Neurological: She is alert and oriented to person, place, and time.  Psychiatric: She has a normal mood and affect. Her behavior is normal. Judgment and thought content normal.          Assessment & Plan:  Other malaise and fatigue - Plan: Comprehensive metabolic panel  Pain in joint, multiple sites - Plan: ANA, CK total and CKMB, Sedimentation rate  Encounter for long-term (current) use of other medications - Plan: CBC with Differential  Cramp of limb - Plan: Magnesium, Phosphorus  Labs pending Will fax to Dr. Corliss Skains Follow up as needed.

## 2013-03-12 LAB — CK TOTAL AND CKMB (NOT AT ARMC)
CK, MB: 0.9 ng/mL (ref 0.3–4.0)
Relative Index: 0.8 (ref 0.0–2.5)
Total CK: 109 U/L (ref 7–177)

## 2013-03-13 LAB — ANA: Anti Nuclear Antibody(ANA): NEGATIVE

## 2013-08-12 ENCOUNTER — Ambulatory Visit: Payer: Self-pay | Admitting: Family Medicine

## 2013-08-12 ENCOUNTER — Other Ambulatory Visit: Payer: Self-pay | Admitting: Family Medicine

## 2013-08-12 VITALS — BP 132/84 | HR 93 | Temp 98.3°F | Resp 17 | Ht 68.0 in | Wt 277.0 lb

## 2013-08-12 DIAGNOSIS — I10 Essential (primary) hypertension: Secondary | ICD-10-CM

## 2013-08-12 DIAGNOSIS — E559 Vitamin D deficiency, unspecified: Secondary | ICD-10-CM

## 2013-08-12 MED ORDER — LOSARTAN POTASSIUM-HCTZ 100-12.5 MG PO TABS: 1.0000 | ORAL_TABLET | Freq: Every day | ORAL | Status: DC

## 2013-08-12 MED ORDER — AMLODIPINE BESYLATE 10 MG PO TABS: 10.0000 mg | ORAL_TABLET | Freq: Every day | ORAL | Status: DC

## 2013-08-12 MED ORDER — BISOPROLOL-HYDROCHLOROTHIAZIDE 10-6.25 MG PO TABS: ORAL_TABLET | ORAL | Status: DC

## 2013-08-12 NOTE — Progress Notes (Signed)
Subjective: Patient is here for refill of her blood pressure medicines. She is doing well with no major complaints. No headaches or dizziness. Rarely gets dizziness. No chest pains or palpitations. She does see a cardiologist apparently. She has a history of lupus and considers Dr. Elbert EwingsL. to be her doctor.  Objective: Neck supple without nodes or thyromegaly. Chest clear. Heart regular without murmurs.  Assessment: Hypertension, out of her medicines  Plan: Prescribed medications with one years worth of refills. Did inform her that she should see Dr. Elbert EwingsL. back in about 6 months.

## 2013-08-12 NOTE — Patient Instructions (Signed)
Continue current blood pressure medication. Advised recheck with Dr. Milus GlazierLauenstein sometime this fall, in about September.  Return if problems

## 2013-10-21 ENCOUNTER — Ambulatory Visit: Payer: Self-pay | Admitting: Internal Medicine

## 2013-10-21 VITALS — BP 128/80 | HR 87 | Temp 98.1°F | Ht 66.0 in | Wt 281.8 lb

## 2013-10-21 DIAGNOSIS — M2550 Pain in unspecified joint: Secondary | ICD-10-CM

## 2013-10-21 DIAGNOSIS — R300 Dysuria: Secondary | ICD-10-CM

## 2013-10-21 DIAGNOSIS — Z79899 Other long term (current) drug therapy: Secondary | ICD-10-CM

## 2013-10-21 LAB — COMPREHENSIVE METABOLIC PANEL
ALT: 10 U/L (ref 0–35)
AST: 14 U/L (ref 0–37)
Albumin: 4 g/dL (ref 3.5–5.2)
Alkaline Phosphatase: 55 U/L (ref 39–117)
BUN: 10 mg/dL (ref 6–23)
CO2: 26 mEq/L (ref 19–32)
Calcium: 9.4 mg/dL (ref 8.4–10.5)
Chloride: 101 mEq/L (ref 96–112)
Creat: 0.95 mg/dL (ref 0.50–1.10)
Glucose, Bld: 79 mg/dL (ref 70–99)
Potassium: 4 mEq/L (ref 3.5–5.3)
Sodium: 137 mEq/L (ref 135–145)
Total Bilirubin: 0.4 mg/dL (ref 0.2–1.2)
Total Protein: 7.1 g/dL (ref 6.0–8.3)

## 2013-10-21 LAB — POCT URINALYSIS DIPSTICK
Bilirubin, UA: NEGATIVE
Glucose, UA: NEGATIVE
Ketones, UA: NEGATIVE
Leukocytes, UA: NEGATIVE
Nitrite, UA: NEGATIVE
Protein, UA: NEGATIVE
Spec Grav, UA: 1.02
Urobilinogen, UA: 0.2
pH, UA: 8

## 2013-10-21 LAB — POCT CBC
Granulocyte percent: 51.8 %G (ref 37–80)
HCT, POC: 38.9 % (ref 37.7–47.9)
HEMOGLOBIN: 12.2 g/dL (ref 12.2–16.2)
Lymph, poc: 1.7 (ref 0.6–3.4)
MCH: 29.2 pg (ref 27–31.2)
MCHC: 31.4 g/dL — AB (ref 31.8–35.4)
MCV: 93 fL (ref 80–97)
MID (cbc): 0.3 (ref 0–0.9)
MPV: 8.7 fL (ref 0–99.8)
POC Granulocyte: 2.1 (ref 2–6.9)
POC LYMPH PERCENT: 40.7 %L (ref 10–50)
POC MID %: 7.5 %M (ref 0–12)
Platelet Count, POC: 311 10*3/uL (ref 142–424)
RBC: 4.18 M/uL (ref 4.04–5.48)
RDW, POC: 15.5 %
WBC: 4.1 10*3/uL — AB (ref 4.6–10.2)

## 2013-10-21 LAB — POCT UA - MICROSCOPIC ONLY
Casts, Ur, LPF, POC: NEGATIVE
Crystals, Ur, HPF, POC: NEGATIVE
RBC, URINE, MICROSCOPIC: NEGATIVE
Yeast, UA: NEGATIVE

## 2013-10-21 LAB — POCT SEDIMENTATION RATE: POCT SED RATE: 31 mm/hr — AB (ref 0–22)

## 2013-10-21 NOTE — Progress Notes (Signed)
   Subjective:    Patient ID: Kathrin GreathouseBarbara A Hansman, female    DOB: 04/25/1966, 48 y.o.   MRN: [id number]  HPI  48 year old AA female [facility] for lab work ordered by Dr.S. Corliss Skainseveshwar with [facility], to evaluate for her lupus.  Pt lives in [place] and can only come on Saturday to get lab work completed.   Pt states symptoms of aches, pains, migraines & headaches are constant.  Pt describes pain as "flu x 10" with all over body aches and pains.  Pt describes pain including headaches are at level of 8/10 and she "just gets used to it."   Review of Systems     Objective:   Physical Exam        Assessment & Plan:

## 2013-10-23 LAB — ANTI-DNA ANTIBODY, DOUBLE-STRANDED

## 2013-10-23 LAB — ANA: Anti Nuclear Antibody(ANA): NEGATIVE

## 2013-11-24 ENCOUNTER — Encounter: Payer: Self-pay | Admitting: Family Medicine

## 2014-09-15 ENCOUNTER — Ambulatory Visit (INDEPENDENT_AMBULATORY_CARE_PROVIDER_SITE_OTHER): Payer: Self-pay | Admitting: Family Medicine

## 2014-09-15 VITALS — BP 138/90 | HR 97 | Temp 97.5°F | Resp 18 | Ht 67.0 in | Wt 289.6 lb

## 2014-09-15 DIAGNOSIS — M329 Systemic lupus erythematosus, unspecified: Secondary | ICD-10-CM

## 2014-09-15 DIAGNOSIS — IMO0002 Reserved for concepts with insufficient information to code with codable children: Secondary | ICD-10-CM

## 2014-09-15 DIAGNOSIS — E669 Obesity, unspecified: Secondary | ICD-10-CM

## 2014-09-15 DIAGNOSIS — I10 Essential (primary) hypertension: Secondary | ICD-10-CM

## 2014-09-15 MED ORDER — AMLODIPINE BESYLATE 10 MG PO TABS: 10.0000 mg | ORAL_TABLET | Freq: Every day | ORAL | Status: DC

## 2014-09-15 MED ORDER — LOSARTAN POTASSIUM-HCTZ 100-12.5 MG PO TABS: 1.0000 | ORAL_TABLET | Freq: Every day | ORAL | Status: DC

## 2014-09-15 MED ORDER — BISOPROLOL-HYDROCHLOROTHIAZIDE 10-6.25 MG PO TABS: ORAL_TABLET | ORAL | Status: DC

## 2014-09-15 NOTE — Patient Instructions (Signed)

## 2014-09-15 NOTE — Progress Notes (Signed)
Subjective:    Patient ID: Kristie Henderson, female    DOB: Oct 18, 1965, 49 y.o.   MRN: [id number]  09/15/2014  Medication Refill and other   HPI This 49 y.o. female presents for one year follow-up of the following:  1. HTN:  One year follow-up.  Patient reports good compliance with medication, good tolerance to medication, and good symptom control.  Checks BP at home; running 120-160/80-90s.  Denies CP/palp/SOB.  +chronic leg swelling without worsening.  No exercise except on Fridays.   2. SLE: followed by Deveschwar every six months . 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  Needs form completed for employment.  Followed every six months by ophthalmology on Plaquenil.  3. Health Maintenance:  Last physical: 2015 Pap smear: 2015 gynecology in [place] Mammogram:  2015 gynecology in [place] Colonoscopy: scheduled for this summer TDAP:  2012 Pneumovax: in past Zostavax:never Influenza:  refuses Eye exam: every six months. Dental exam:     Review of Systems  Constitutional: Negative for fever, chills, diaphoresis, activity change, appetite change, fatigue and unexpected weight change.  HENT: Negative for congestion, dental problem, drooling, ear discharge, ear pain, facial swelling, hearing loss, mouth sores, nosebleeds, postnasal drip, rhinorrhea, sinus pressure, sneezing, sore throat, tinnitus, trouble swallowing and voice change.   Eyes: Negative for photophobia, pain, discharge, redness, itching and visual disturbance.  Respiratory: Negative for apnea, cough, choking, chest tightness, shortness of breath, wheezing and stridor.   Cardiovascular: Negative for chest pain, palpitations and leg swelling.  Gastrointestinal: Negative for nausea, vomiting, abdominal pain, diarrhea, constipation, blood in stool, abdominal distention, anal bleeding and rectal pain.  Endocrine: Negative for cold intolerance, heat intolerance, polydipsia, polyphagia and  polyuria.  Genitourinary: Negative for dysuria, urgency, frequency, hematuria, flank pain, decreased urine volume, vaginal bleeding, vaginal discharge, enuresis, difficulty urinating, genital sores, vaginal pain, menstrual problem, pelvic pain and dyspareunia.  Musculoskeletal: Positive for arthralgias. Negative for myalgias, back pain, joint swelling, gait problem, neck pain and neck stiffness.  Skin: Negative for color change, pallor, rash and wound.  Allergic/Immunologic: Negative for environmental allergies, food allergies and immunocompromised state.  Neurological: Negative for dizziness, tremors, seizures, syncope, facial asymmetry, speech difficulty, weakness, light-headedness, numbness and headaches.  Hematological: Negative for adenopathy. Does not bruise/bleed easily.  Psychiatric/Behavioral: Negative for suicidal ideas, hallucinations, behavioral problems, confusion, sleep disturbance, self-injury, dysphoric mood, decreased concentration and agitation. The patient is not nervous/anxious and is not hyperactive.     Past Medical History  Diagnosis Date  . Lupus 06/08/2008    Deveschwar every three months  . Hypertension    Past Surgical History  Procedure Laterality Date  . Cesarean section    . Uterine fibroid surgery     No Known Allergies Current Outpatient Prescriptions  Medication Sig Dispense Refill  . bisoprolol-hydrochlorothiazide (ZIAC) 10-6.25 MG per tablet TAKE 1 TABLET BY MOUTH DAILY 90 tablet 3  . Cholecalciferol (DIALYVITE VITAMIN D3 MAX) 14782 UNITS TABS Take 1 capsule by mouth 2 (two) times a week. 30 tablet 1  . ergocalciferol (VITAMIN D2) 50000 UNITS capsule Take 1 capsule (50,000 Units total) by mouth 2 (two) times a week. 8 capsule 2  . hydroxychloroquine (PLAQUENIL) 200 MG tablet Take 200 mg by mouth 2 (two) times daily. Pt taking med as ordered per pt at visit today 5/16.    Marland Kitchen ibuprofen (ADVIL,MOTRIN) 200 MG tablet Take 200 mg by mouth 2 (two) times daily.      Marland Kitchen NAPROXEN PO Take  by mouth.    Marland Kitchen. amLODipine (NORVASC) 10 MG tablet Take 1 tablet (10 mg total) by mouth daily. 90 tablet 3  . losartan-hydrochlorothiazide (HYZAAR) 100-12.5 MG per tablet Take 1 tablet by mouth daily. 90 tablet 3   No current facility-administered medications for this visit.   History   Social History  . Marital Status: Single    Spouse Name: N/A  . Number of Children: N/A  . Years of Education: N/A   Occupational History  . Not on file.   Social History Main Topics  . Smoking status: Never Smoker   . Smokeless tobacco: Never Used  . Alcohol Use: No  . Drug Use: No  . Sexual Activity: Not on file   Other Topics Concern  . Not on file   Social History Narrative   Marital status: single; dating seriously x 4 years.      Children;  4 children; 2 adopted children; 10 grandchildren      Lives: alone, youngest daughter; all children in [place].      Employment: Midwifekindergarten teacher x 10 years.      Tobacco: none      Alcohol: none     Drugs; none      Exercise: exercise once per week   Family History  Problem Relation Age of Onset  . Stroke Maternal Grandmother   . Heart disease Maternal Grandfather   . Cancer Paternal Grandfather   . Hypertension Mother   . Cancer Father [id number]    Lung Cancer        Objective:    BP 138/90 mmHg  Pulse 97  Temp(Src) 97.5 F (36.4 C) (Oral)  Resp 18  Ht 5\' 7"  (1.702 m)  Wt 289 lb 9.6 oz (131.362 kg)  BMI 45.35 kg/m2  SpO2 100%  LMP 12/12/2011 Physical Exam  Constitutional: She is oriented to person, place, and time. She appears well-developed and well-nourished. No distress.  obese  HENT:  Head: Normocephalic and atraumatic.  Right Ear: External ear normal.  Left Ear: External ear normal.  Nose: Nose normal.  Mouth/Throat: Oropharynx is clear and moist.  Eyes: Conjunctivae and EOM are normal. Pupils are equal, round, and reactive to light.  Neck: Normal range of motion and full passive range of motion  without pain. Neck supple. No JVD present. Carotid bruit is not present. No thyromegaly present.  Cardiovascular: Normal rate, regular rhythm and normal heart sounds.  Exam reveals no gallop and no friction rub.   No murmur heard. Non-pitting edema 1+ BLE.  Pulmonary/Chest: Effort normal and breath sounds normal. She has no wheezes. She has no rales.  Abdominal: Soft. Bowel sounds are normal. She exhibits no distension and no mass. There is no tenderness. There is no rebound and no guarding.  Musculoskeletal:       Right shoulder: Normal.       Left shoulder: Normal.       Right elbow: Normal.      Left elbow: Normal.       Right wrist: Normal.       Left wrist: Normal.       Cervical back: Normal.       Thoracic back: Normal.       Lumbar back: Normal.  Lymphadenopathy:    She has no cervical adenopathy.  Neurological: She is alert and oriented to person, place, and time. She has normal reflexes. No cranial nerve deficit. She exhibits normal muscle tone. Coordination normal.  Skin: Skin is warm and  dry. No rash noted. She is not diaphoretic. No erythema. No pallor.  Psychiatric: She has a normal mood and affect. Her behavior is normal. Judgment and thought content normal.  Nursing note and vitals reviewed.       Assessment & Plan:   1. Essential hypertension   2. Lupus   3. Obesity      1. HTN: borderline control today; recommend checking BP daily for next two weeks; if remains borderline, would adjust Hyzaar dose to 100/25.  To undergo labs with Dr. Link Snuffer next week. 2.  Lupus: stable; followed by Deveschwar/rheumatology.  Medical clearance for employment; no disabilities that interfere with job junction. 3.  Obesity: recommend weight loss, exercise, low-fat and low-calorie food intake.    Meds ordered this encounter  Medications  . amLODipine (NORVASC) 10 MG tablet    Sig: Take 1 tablet (10 mg total) by mouth daily.    Dispense:  90 tablet    Refill:  3  .  bisoprolol-hydrochlorothiazide (ZIAC) 10-6.25 MG per tablet    Sig: TAKE 1 TABLET BY MOUTH DAILY    Dispense:  90 tablet    Refill:  3  . losartan-hydrochlorothiazide (HYZAAR) 100-12.5 MG per tablet    Sig: Take 1 tablet by mouth daily.    Dispense:  90 tablet    Refill:  3    Return in about 6 months (around 03/17/2015) for recheck blood pressure.    Kristie Henderson Paulita Fujita, M.D. [facility] [address] [place], [place]  [address] [phone] phone [phone] fax

## 2015-05-20 LAB — HEPATIC FUNCTION PANEL: Bilirubin, Total: 0.4 mg/dL

## 2015-05-20 LAB — BASIC METABOLIC PANEL
BUN: 11 mg/dL (ref 4–21)
Glucose: 99 mg/dL
Sodium: 140 mmol/L (ref 137–147)

## 2015-05-20 LAB — TSH: TSH: 1.55 u[IU]/mL (ref <=5.90)

## 2015-05-20 LAB — POCT ERYTHROCYTE SEDIMENTATION RATE, NON-AUTOMATED: Sed Rate: 17 mm

## 2015-06-19 ENCOUNTER — Telehealth: Payer: Self-pay | Admitting: Family Medicine

## 2015-06-19 NOTE — Telephone Encounter (Signed)
Left a message for patient to return call to schedule appointment for hypertension.  She was suppose to follow up in October

## 2015-06-21 ENCOUNTER — Telehealth: Payer: Self-pay

## 2015-06-21 NOTE — Telephone Encounter (Signed)
Pt stated she will come in to walk in center on the weekend for her follow up on hypertension with Dr. Katrinka BlazingSmith. She is now in [place].

## 2015-06-23 NOTE — Telephone Encounter (Signed)
Noted  

## 2015-07-06 ENCOUNTER — Ambulatory Visit (INDEPENDENT_AMBULATORY_CARE_PROVIDER_SITE_OTHER): Payer: Self-pay | Admitting: Family Medicine

## 2015-07-06 VITALS — BP 132/86 | HR 82 | Temp 98.3°F | Resp 17 | Ht 67.0 in | Wt 288.0 lb

## 2015-07-06 DIAGNOSIS — M329 Systemic lupus erythematosus, unspecified: Secondary | ICD-10-CM

## 2015-07-06 DIAGNOSIS — I10 Essential (primary) hypertension: Secondary | ICD-10-CM

## 2015-07-06 DIAGNOSIS — E669 Obesity, unspecified: Secondary | ICD-10-CM

## 2015-07-06 MED ORDER — BISOPROLOL-HYDROCHLOROTHIAZIDE 10-6.25 MG PO TABS: ORAL_TABLET | ORAL | Status: DC

## 2015-07-06 MED ORDER — LOSARTAN POTASSIUM-HCTZ 100-25 MG PO TABS: 1.0000 | ORAL_TABLET | Freq: Every day | ORAL | Status: DC

## 2015-07-06 MED ORDER — AMLODIPINE BESYLATE 10 MG PO TABS
10.0000 mg | ORAL_TABLET | Freq: Every day | ORAL | Status: DC
Start: 2015-07-06 — End: 2017-11-08

## 2015-07-06 NOTE — Progress Notes (Signed)
Subjective:    Patient ID: Kristie Henderson, female    DOB: 1966/01/28, 50 y.o.   MRN: [id number]  07/06/2015  Follow-up   HPI This 50 y.o. female presents for nine month follow-up:  1.  HTN:  Patient reports good compliance with medication, good tolerance to medication, and good symptom control.  Checking BP 130/80s.  Checks twice weekly.  Never low readings.  Highest readings 185/90 before.  Usually due to finals or stressors.   [employer].  4.0.  Graduated.   2.  SLE: stopped Plaquenil on own. Discussed with rheumatologist; afraid that going out of remission; had remission for three months.    Once every three months for menses.  Gynecologist [facility] in 09/2014. Mammogram UTD Colonoscopy 2016 in [place]. TDAP 2012 Flu vaccine refuses Last HIV 2000.  No sexual activity in 2000.  Review of Systems  Constitutional: Negative for fever, chills, diaphoresis and fatigue.  Eyes: Negative for visual disturbance.  Respiratory: Negative for cough and shortness of breath.   Cardiovascular: Negative for chest pain, palpitations and leg swelling.  Gastrointestinal: Negative for nausea, vomiting, abdominal pain, diarrhea and constipation.  Endocrine: Negative for cold intolerance, heat intolerance, polydipsia, polyphagia and polyuria.  Neurological: Negative for dizziness, tremors, seizures, syncope, facial asymmetry, speech difficulty, weakness, light-headedness, numbness and headaches.    Past Medical History  Diagnosis Date  . Lupus (HCC) 06/08/2008    Deveschwar every three months  . Hypertension    Past Surgical History  Procedure Laterality Date  . Cesarean section    . Uterine fibroid surgery     No Known Allergies  Social History   Social History  . Marital Status: Single    Spouse Name: N/A  . Number of Children: N/A  . Years of Education: N/A   Occupational History  . Not on file.   Social History Main Topics  . Smoking status: Never Smoker   . Smokeless tobacco:  Never Used  . Alcohol Use: No  . Drug Use: No  . Sexual Activity: Not on file   Other Topics Concern  . Not on file   Social History Narrative   Marital status: single; not dating in 06/2015.      Children;  4 children; 2 adopted children; 13 grandchildren      Lives: alone, youngest daughter; all children in [place].      Employment: Midwife x 11 years.      Tobacco: none      Alcohol: none     Drugs; none      Exercise: no formal exercise outside of work.   Family History  Problem Relation Age of Onset  . Stroke Maternal Grandmother   . Heart disease Maternal Grandfather   . Cancer Paternal Grandfather   . Hypertension Mother   . Cancer Father 31    Lung Cancer  . Hypertension Sister        Objective:    BP 132/86 mmHg  Pulse 82  Temp(Src) 98.3 F (36.8 C) (Oral)  Resp 17  Ht  (1.702 m)  Wt 288 lb (130.636 kg)  BMI 45.10 kg/m2  SpO2 98%  LMP 12/12/2011 Physical Exam  Constitutional: She is oriented to person, place, and time. She appears well-developed and well-nourished. No distress.  HENT:  Head: Normocephalic and atraumatic.  Right Ear: External ear normal.  Left Ear: External ear normal.  Nose: Nose normal.  Mouth/Throat: Oropharynx is clear and moist.  Eyes: Conjunctivae and EOM are  normal. Pupils are equal, round, and reactive to light.  Neck: Normal range of motion. Neck supple. Carotid bruit is not present. No thyromegaly present.  Cardiovascular: Normal rate, regular rhythm, normal heart sounds and intact distal pulses.  Exam reveals no gallop and no friction rub.   No murmur heard. Pulmonary/Chest: Effort normal and breath sounds normal. She has no wheezes. She has no rales.  Abdominal: Soft. Bowel sounds are normal. She exhibits no distension and no mass. There is no tenderness. There is no rebound and no guarding.  Lymphadenopathy:    She has no cervical adenopathy.  Neurological: She is alert and oriented to person, place,  and time. No cranial nerve deficit.  Skin: Skin is warm and dry. No rash noted. She is not diaphoretic. No erythema. No pallor.  Psychiatric: She has a normal mood and affect. Her behavior is normal.        Assessment & Plan:   1. Essential hypertension   2. Lupus (HCC)   3. Obesity     1. HTN: borderline readings; increase Losartan-HCTZ to 100-25.  Continue Amlodipine and Bisoprolol-HCTZ at current doses.  Recent labs with CBC, CMET, u/a, TSH reviewed from rheumatology and WNL. 2.  SLE/lupus: greatly improved in past three months and now having recurrent joint symptoms; off medications currently but plans to start MTX. 3. Obesity: persistent; recommend weight loss, exercise, and low-fat low-caloric food choices.   No orders of the defined types were placed in this encounter.   Meds ordered this encounter  Medications  . losartan-hydrochlorothiazide (HYZAAR) 100-25 MG tablet    Sig: Take 1 tablet by mouth daily.    Dispense:  90 tablet    Refill:  3  . amLODipine (NORVASC) 10 MG tablet    Sig: Take 1 tablet (10 mg total) by mouth daily.    Dispense:  90 tablet    Refill:  3  . bisoprolol-hydrochlorothiazide (ZIAC) 10-6.25 MG tablet    Sig: TAKE 1 TABLET BY MOUTH DAILY    Dispense:  90 tablet    Refill:  3    Return in about 6 months (around 01/03/2016) for recheck.    Amarys Sliwinski Paulita Fujita, M.D. [facility] [address] [place], [place]  [address] [phone] phone [phone] fax

## 2015-07-06 NOTE — Patient Instructions (Signed)

## 2015-11-16 ENCOUNTER — Ambulatory Visit (INDEPENDENT_AMBULATORY_CARE_PROVIDER_SITE_OTHER): Payer: Self-pay | Admitting: Internal Medicine

## 2015-11-16 VITALS — BP 134/90 | HR 100 | Temp 98.6°F | Resp 18 | Ht 67.0 in | Wt 286.0 lb

## 2015-11-16 DIAGNOSIS — M329 Systemic lupus erythematosus, unspecified: Secondary | ICD-10-CM

## 2015-11-16 DIAGNOSIS — R011 Cardiac murmur, unspecified: Secondary | ICD-10-CM

## 2015-11-16 DIAGNOSIS — M2550 Pain in unspecified joint: Secondary | ICD-10-CM

## 2015-11-16 LAB — POCT CBC
Granulocyte percent: 46.5 %G (ref 37–80)
HCT, POC: 34 % — AB (ref 37.7–47.9)
Hemoglobin: 11.6 g/dL — AB (ref 12.2–16.2)
LYMPH, POC: 1.8 (ref 0.6–3.4)
MCH, POC: 29.3 pg (ref 27–31.2)
MCHC: 34 g/dL (ref 31.8–35.4)
MCV: 86.2 fL (ref 80–97)
MID (CBC): 0.3 (ref 0–0.9)
MPV: 7.7 fL (ref 0–99.8)
PLATELET COUNT, POC: 281 10*3/uL (ref 142–424)
POC Granulocyte: 1.9 — AB (ref 2–6.9)
POC LYMPH %: 45.4 % (ref 10–50)
POC MID %: 8.1 %M (ref 0–12)
RBC: 3.95 M/uL — AB (ref 4.04–5.48)
RDW, POC: 14.5 %
WBC: 4 10*3/uL — AB (ref 4.6–10.2)

## 2015-11-16 LAB — COMPREHENSIVE METABOLIC PANEL
ALT: 16 U/L (ref 6–29)
AST: 14 U/L (ref 10–35)
Albumin: 4 g/dL (ref 3.6–5.1)
Alkaline Phosphatase: 56 U/L (ref 33–130)
BUN: 10 mg/dL (ref 7–25)
CHLORIDE: 102 mmol/L (ref 98–110)
CO2: 28 mmol/L (ref 20–31)
CREATININE: 0.95 mg/dL (ref 0.50–1.05)
Calcium: 9.1 mg/dL (ref 8.6–10.4)
GLUCOSE: 89 mg/dL (ref 65–99)
POTASSIUM: 4.5 mmol/L (ref 3.5–5.3)
SODIUM: 137 mmol/L (ref 135–146)
TOTAL PROTEIN: 7 g/dL (ref 6.1–8.1)
Total Bilirubin: 0.4 mg/dL (ref 0.2–1.2)

## 2015-11-16 LAB — POCT URINALYSIS DIP (MANUAL ENTRY)
BILIRUBIN UA: NEGATIVE
GLUCOSE UA: NEGATIVE
Ketones, POC UA: NEGATIVE
Leukocytes, UA: NEGATIVE
NITRITE UA: NEGATIVE
Protein Ur, POC: 30 — AB
RBC UA: NEGATIVE
Spec Grav, UA: 1.025
Urobilinogen, UA: 1
pH, UA: 6

## 2015-11-16 LAB — POC MICROSCOPIC URINALYSIS (UMFC)

## 2015-11-16 LAB — POCT SEDIMENTATION RATE: POCT SED RATE: 43 mm/h — AB (ref 0–22)

## 2015-11-16 NOTE — Patient Instructions (Addendum)
     IF you received an x-ray today, you will receive an invoice from [facility]. Please contact [facility] at [phone] with questions or concerns regarding your invoice.   IF you received labwork today, you will receive an invoice from [facility]. Please contact [facility] at [phone] with questions or concerns regarding your invoice.   Our billing staff will not be able to assist you with questions regarding bills from these companies.  You will be contacted with the lab results as soon as they are available. The fastest way to get your results is to activate your My Chart account. Instructions are located on the last page of this paperwork. If you have not heard from us regarding the results in 2 weeks, please contact this office.     We recommend that you schedule a mammogram for breast cancer screening. Typically, you do not need a referral to do this. Please contact a local imaging center to schedule your mammogram.   Hospital - [phone]  *ask for the Radiology Department [facility]) - [phone] or [phone]  [facility] - [phone] [facility] - [phone] [facility] - [phone]  *ask for the Radiology Department [facility] - [phone]  *ask for the Radiology Department [facility] - [phone]  *ask for the Mammography Department [facility] - [phone]  

## 2015-11-18 LAB — C3 AND C4
C3 COMPLEMENT: 144 mg/dL (ref 90–180)
C4 Complement: 36 mg/dL (ref 16–47)

## 2015-11-18 LAB — VITAMIN D 25 HYDROXY (VIT D DEFICIENCY, FRACTURES): VIT D 25 HYDROXY: 26 ng/mL — AB (ref 30–100)

## 2015-11-18 NOTE — Progress Notes (Signed)
   Subjective:    Patient ID: Kathrin GreathouseBarbara A Truex, female    DOB: 05/26/1966, 50 y.o.   MRN: [id number]  HPI  Chief Complaint  Patient presents with  . Heart Problem---recent exam was told Murmur!! Not heard before Is asymptomatic  . Labs Only---joint sxtoms thought to possibly represent lupus relapse and Dr Link Snuffereveschwar has sent note for multiple labs       Review of Systems [place]    Objective:   Physical Exam BP 134/90 mmHg  Pulse 100  Temp(Src) 98.6 F (37 C) (Oral)  Resp 18  Ht 5\' 7"  (1.702 m)  Wt 286 lb (129.729 kg)  BMI 44.78 kg/m2  SpO2 98%  LMP 12/12/2011 HEENT clear No carotid bruit No thyromeg Heart reg with 2/6 M at base that disappears when supine Lungs clear extr clear Neuro intact  EKG wnl     Assessment & Plan:  Lupus (HCC) - Plan: ENA 9 Panel, ANA, CANCELED: ANA w/Reflex  Heart murmur - Plan: EKG 12-Lead  Pain in joint, multiple sites - Plan: POCT CBC, POCT SEDIMENTATION RATE, POCT urinalysis dipstick, POCT Microscopic Urinalysis ([facility]), Vitamin D, 25-hydroxy, C3 and C4, Comprehensive metabolic panel, ENA 9 Panel, ANA, CANCELED: ANA w/Reflex  Reassured flow m likely versus early m from Aor Valve which can be reassessed yearly

## 2015-11-19 LAB — ANA: ANA: NEGATIVE

## 2015-11-22 LAB — RIBOSOMAL P PROTEIN AB: Ribosomal P Protein Ab: <1

## 2015-11-22 LAB — SJOGRENS SYNDROME-B EXTRACTABLE NUCLEAR ANTIBODY: SSB (LA) (ENA) ANTIBODY, IGG: NEGATIVE

## 2015-11-22 LAB — ANTI-SMITH ANTIBODY: ENA SM Ab Ser-aCnc: <1

## 2015-11-22 LAB — SJOGRENS SYNDROME-A EXTRACTABLE NUCLEAR ANTIBODY: SSA (RO) (ENA) ANTIBODY, IGG: NEGATIVE

## 2015-11-22 LAB — JO-1 ANTIBODY-IGG: JO-1 ANTIBODY, IGG: NEGATIVE

## 2015-11-22 LAB — ANTI-SCLERODERMA ANTIBODY: Scleroderma (Scl-70) (ENA) Antibody, IgG: <1

## 2015-11-22 LAB — ANTI-DNA ANTIBODY, DOUBLE-STRANDED: ds DNA Ab: <1 IU/mL

## 2015-11-22 LAB — CENTROMERE ANTIBODIES: CENTROMERE AB SCREEN: NEGATIVE

## 2015-11-22 LAB — ANTI-RIBONUCLEIC ACID ANTIBODY: SM/RNP: <1

## 2015-11-26 ENCOUNTER — Encounter: Payer: Self-pay | Admitting: Internal Medicine

## 2016-05-13 ENCOUNTER — Telehealth: Payer: Self-pay

## 2016-05-13 NOTE — Telephone Encounter (Signed)
ERROR

## 2016-05-19 ENCOUNTER — Ambulatory Visit (INDEPENDENT_AMBULATORY_CARE_PROVIDER_SITE_OTHER): Payer: Self-pay | Admitting: Family Medicine

## 2016-05-19 ENCOUNTER — Encounter: Payer: Self-pay | Admitting: Family Medicine

## 2016-05-19 VITALS — BP 126/88 | HR 84 | Temp 99.2°F | Resp 18 | Ht 67.0 in | Wt 283.2 lb

## 2016-05-19 DIAGNOSIS — M3219 Other organ or system involvement in systemic lupus erythematosus: Secondary | ICD-10-CM

## 2016-05-19 DIAGNOSIS — Z2089 Contact with and (suspected) exposure to other communicable diseases: Secondary | ICD-10-CM

## 2016-05-19 MED ORDER — AMOXICILLIN-POT CLAVULANATE 875-125 MG PO TABS: 1.0000 | ORAL_TABLET | Freq: Two times a day (BID) | ORAL | 0 refills | Status: DC

## 2016-05-19 NOTE — Patient Instructions (Signed)
     IF you received an x-ray today, you will receive an invoice from [facility]. Please contact [facility] at [phone] with questions or concerns regarding your invoice.   IF you received labwork today, you will receive an invoice from [facility]. Please contact [facility] at [phone] with questions or concerns regarding your invoice.   Our billing staff will not be able to assist you with questions regarding bills from these companies.  You will be contacted with the lab results as soon as they are available. The fastest way to get your results is to activate your My Chart account. Instructions are located on the last page of this paperwork. If you have not heard from us regarding the results in 2 weeks, please contact this office.      

## 2016-05-19 NOTE — Progress Notes (Signed)
Subjective:    Patient ID: Kristie GreathouseBarbara A Henderson, female    DOB: 11/04/1965, 50 y.o.   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 female presents for evaluation of pneumococcal meningitis exposure. Held grandson four days ago in hospital.   Visiting grandson over the weekend; seven weeks old.  Transferred to [facility] for PICC line.  S/p spinal tap that confirmed meningitis.  Presented with fever.  In hospital room with infant/newborn this week.  Known SLE; thus pediatrician recommended prophylaxis.  No fever/chills/sweats. Does have a low grade temperature today.  No headache, ear pain, sore throat, rhinorrhea, nasal congestion, cough; no v/d/c.  No rash other than facial butterfly rash associated with SLE.  No active immunosuppressive medications. Last medication six years ago.  Was taking Plaquenil, Humira, MTX in the past.  Works in daycare setting.  Exposed to toddlers every day.  Feels well. Denies fever, chills/sweats.    Immunization History  Administered Date(s) Administered  . Tdap 06/08/2010    Review of Systems  Constitutional: Negative for chills, diaphoresis, fatigue and fever.  Eyes: Negative for visual disturbance.  Respiratory: Negative for cough and shortness of breath.   Cardiovascular: Negative for chest pain, palpitations and leg swelling.  Gastrointestinal: Negative for abdominal pain, constipation, diarrhea, nausea and vomiting.  Endocrine: Negative for cold intolerance, heat intolerance, polydipsia, polyphagia and polyuria.  Neurological: Negative for dizziness, tremors, seizures, syncope, facial asymmetry, speech difficulty, weakness, light-headedness, numbness and headaches.    Past Medical History:  Diagnosis Date  . Hypertension   . Lupus 06/08/2008   Deveschwar every three months   Past Surgical History:  Procedure Laterality Date  . CESAREAN SECTION    . UTERINE FIBROID SURGERY     No Known  Allergies Current Outpatient Prescriptions  Medication Sig Dispense Refill  . amLODipine (NORVASC) 10 MG tablet Take 1 tablet (10 mg total) by mouth daily. 90 tablet 3  . bisoprolol-hydrochlorothiazide (ZIAC) 10-6.25 MG tablet TAKE 1 TABLET BY MOUTH DAILY 90 tablet 3  . Cholecalciferol (DIALYVITE VITAMIN D3 MAX) 1324450000 UNITS TABS Take 1 capsule by mouth 2 (two) times a week. 30 tablet 1  . ergocalciferol (VITAMIN D2) 50000 UNITS capsule Take 1 capsule (50,000 Units total) by mouth 2 (two) times a week. 8 capsule 2  . ibuprofen (ADVIL,MOTRIN) 200 MG tablet Take 200 mg by mouth 2 (two) times daily.    Marland Kitchen. losartan-hydrochlorothiazide (HYZAAR) 100-25 MG tablet Take 1 tablet by mouth daily. 90 tablet 3  . NAPROXEN PO Take by mouth.    Marland Kitchen. amoxicillin-clavulanate (AUGMENTIN) 875-125 MG tablet Take 1 tablet by mouth 2 (two) times daily. 20 tablet 0   No current facility-administered medications for this visit.    Social History   Social History  . Marital status: Single    Spouse name: N/A  . Number of children: N/A  . Years of education: N/A   Occupational History  . Not on file.   Social History Main Topics  . Smoking status: Never Smoker  . Smokeless tobacco: Never Used  . Alcohol use No  . Drug use: No  . Sexual activity: Not on file   Other Topics Concern  . Not on file   Social History Narrative   Marital status: single; not dating in 06/2015.      Children;  4 children; 2 adopted children; 13 grandchildren      Lives: alone, youngest daughter; all children in [place].  Employment: Midwifekindergarten teacher x 11 years.      Tobacco: none      Alcohol: none     Drugs; none      Exercise: no formal exercise outside of work.   Family History  Problem Relation Age of Onset  . Hypertension Mother   . Cancer Father [id number]    Lung Cancer  . Hypertension Sister   . Stroke Maternal Grandmother   . Heart disease Maternal Grandfather   . Cancer Paternal Grandfather          Objective:    BP 126/88 (BP Location: Left Arm, Patient Position: Sitting, Cuff Size: Large)   Pulse 84   Temp 99.2 F (37.3 C) (Oral)   Resp 18   Ht 5\' 7"  (1.702 m)   Wt 283 lb 3.2 oz (128.5 kg)   LMP 12/12/2011   SpO2 99%   BMI 44.36 kg/m  Physical Exam  Constitutional: She is oriented to person, place, and time. She appears well-developed and well-nourished. No distress.  HENT:  Head: Normocephalic and atraumatic.  Right Ear: External ear normal.  Left Ear: External ear normal.  Nose: Nose normal.  Mouth/Throat: Oropharynx is clear and moist.  Eyes: Conjunctivae and EOM are normal. Pupils are equal, round, and reactive to light.  Neck: Normal range of motion. Neck supple. Carotid bruit is not present. No thyromegaly present.  Cardiovascular: Normal rate, regular rhythm, normal heart sounds and intact distal pulses.  Exam reveals no gallop and no friction rub.   No murmur heard. Pulmonary/Chest: Effort normal and breath sounds normal. She has no wheezes. She has no rales.  Lymphadenopathy:    She has no cervical adenopathy.  Neurological: She is alert and oriented to person, place, and time. No cranial nerve deficit.  Skin: Skin is warm and dry. No rash noted. She is not diaphoretic. No erythema. No pallor.  Psychiatric: She has a normal mood and affect. Her behavior is normal.        Assessment & Plan:   1. Exposure to meningitis   2. 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  Exposure to pneumococcal meningitis in seven week old infant; discussed nature of spread; very unlikely that patient would develop meningitis due to age; not currently maintained on immunosuppressive therapy. -rx for Augmentin provided if develops fever or other related symptoms; has low grade fever in office today yet asymptomatic.  Non-focal exam.  May develop nasal congestion or cough if developed pneumococcal infection.   No orders of the defined types  were placed in this encounter.  Meds ordered this encounter  Medications  . amoxicillin-clavulanate (AUGMENTIN) 875-125 MG tablet    Sig: Take 1 tablet by mouth 2 (two) times daily.    Dispense:  20 tablet    Refill:  0    No Follow-up on file.   Elai Vanwyk Paulita FujitaMartin Carsyn Taubman, M.D. [facility]   [address] [place], [place]  [phone] phone [phone] fax

## 2016-10-26 ENCOUNTER — Telehealth: Payer: Self-pay | Admitting: Family Medicine

## 2016-10-26 NOTE — Telephone Encounter (Signed)
Pt lives in [place] and need a fit to work sheet filled out she saw Dr Katrinka BlazingSmith in Dec 2017 she will fax the paper to us to see if a provider can fill it out I spoke with karen about this

## 2016-11-06 NOTE — Telephone Encounter (Signed)
Form completed and will be faxed on Saturday, 11/07/16. Also, pt is due for follow-up with me for hypertension. Please schedule an OV in the upcoming month.

## 2017-11-01 ENCOUNTER — Encounter: Payer: Self-pay | Admitting: Family Medicine

## 2017-11-03 ENCOUNTER — Ambulatory Visit: Payer: Self-pay | Admitting: Family Medicine

## 2017-11-08 ENCOUNTER — Encounter: Payer: Self-pay | Admitting: Family Medicine

## 2017-11-08 ENCOUNTER — Other Ambulatory Visit: Payer: Self-pay

## 2017-11-08 ENCOUNTER — Ambulatory Visit (INDEPENDENT_AMBULATORY_CARE_PROVIDER_SITE_OTHER): Payer: Self-pay | Admitting: Family Medicine

## 2017-11-08 VITALS — BP 162/88 | HR 95 | Temp 98.0°F | Resp 16 | Ht 67.32 in | Wt 267.0 lb

## 2017-11-08 DIAGNOSIS — I10 Essential (primary) hypertension: Secondary | ICD-10-CM

## 2017-11-08 DIAGNOSIS — Z6841 Body Mass Index (BMI) 40.0 and over, adult: Secondary | ICD-10-CM

## 2017-11-08 DIAGNOSIS — M329 Systemic lupus erythematosus, unspecified: Secondary | ICD-10-CM

## 2017-11-08 MED ORDER — LOSARTAN POTASSIUM-HCTZ 100-25 MG PO TABS: 1.0000 | ORAL_TABLET | Freq: Every day | ORAL | 1 refills | Status: AC

## 2017-11-08 MED ORDER — AMLODIPINE BESYLATE 10 MG PO TABS: 10.0000 mg | ORAL_TABLET | Freq: Every day | ORAL | 1 refills | Status: AC

## 2017-11-08 NOTE — Progress Notes (Signed)
Subjective:    Patient ID: Kristie Henderson, female    DOB: 1966-05-29, 52 y.o.   MRN: [id number]  11/08/2017  Hypertension    HPI This 52 y.o. female presents for evaluation of hypertension.  Developed headaches; worried about blood pressure being elevated.  Went home to [place], [place].   Mother with stage IV metatstatic lung cancer.  Stayed in [place] and started working there. Called minister to advise of mother's diagnosis.  In [place] for three months; now at head start. Ran out of medications.   Out of school since 10/29/17.    BP Readings from Last 3 Encounters:  11/08/17 (!) 162/88  05/19/16 126/88  11/16/15 134/90   Wt Readings from Last 3 Encounters:  11/08/17 267 lb (121.1 kg)  05/19/16 283 lb 3.2 oz (128.5 kg)  11/16/15 286 lb (129.7 kg)   Immunization History  Administered Date(s) Administered  . Tdap 06/08/2010    Review of Systems  Constitutional: Negative for activity change, appetite change, chills, diaphoresis, fatigue, fever and unexpected weight change.  HENT: Negative for congestion, dental problem, drooling, ear discharge, ear pain, facial swelling, hearing loss, mouth sores, nosebleeds, postnasal drip, rhinorrhea, sinus pressure, sneezing, sore throat, tinnitus, trouble swallowing and voice change.   Eyes: Negative for photophobia, pain, discharge, redness, itching and visual disturbance.  Respiratory: Negative for apnea, cough, choking, chest tightness, shortness of breath, wheezing and stridor.   Cardiovascular: Negative for chest pain, palpitations and leg swelling.  Gastrointestinal: Negative for abdominal distention, abdominal pain, anal bleeding, blood in stool, constipation, diarrhea, nausea, rectal pain and vomiting.  Endocrine: Negative for cold intolerance, heat intolerance, polydipsia, polyphagia and polyuria.  Genitourinary: Negative for decreased urine volume, difficulty urinating, dyspareunia, dysuria, enuresis, flank pain, frequency, genital  sores, hematuria, menstrual problem, pelvic pain, urgency, vaginal bleeding, vaginal discharge and vaginal pain.  Musculoskeletal: Negative for arthralgias, back pain, gait problem, joint swelling, myalgias, neck pain and neck stiffness.  Skin: Negative for color change, pallor, rash and wound.  Allergic/Immunologic: Negative for environmental allergies, food allergies and immunocompromised state.  Neurological: Negative for dizziness, tremors, seizures, syncope, facial asymmetry, speech difficulty, weakness, light-headedness, numbness and headaches.  Hematological: Negative for adenopathy. Does not bruise/bleed easily.  Psychiatric/Behavioral: Negative for agitation, behavioral problems, confusion, decreased concentration, dysphoric mood, hallucinations, self-injury, sleep disturbance and suicidal ideas. The patient is not nervous/anxious and is not hyperactive.     Past Medical History:  Diagnosis Date  . Hypertension   . Lupus (HCC) 06/08/2008   [place]war every three months   Past Surgical History:  Procedure Laterality Date  . CESAREAN SECTION    . UTERINE FIBROID SURGERY     No Known Allergies Current Outpatient Medications on File Prior to Visit  Medication Sig Dispense Refill  . bisoprolol-hydrochlorothiazide (ZIAC) 10-6.25 MG tablet TAKE 1 TABLET BY MOUTH DAILY 90 tablet 3  . Cholecalciferol (DIALYVITE VITAMIN D3 MAX) 82956 UNITS TABS Take 1 capsule by mouth 2 (two) times a week. 30 tablet 1  . ergocalciferol (VITAMIN D2) 50000 UNITS capsule Take 1 capsule (50,000 Units total) by mouth 2 (two) times a week. 8 capsule 2  . ibuprofen (ADVIL,MOTRIN) 200 MG tablet Take 200 mg by mouth 2 (two) times daily.    Marland Kitchen NAPROXEN PO Take by mouth.     No current facility-administered medications on file prior to visit.    Social History   Socioeconomic History  . Marital status: Single    Spouse name: Not on file  . Number of children:  Not on file  . Years of education: Not on file  .  Highest education level: Not on file  Occupational History  . Not on file  Social Needs  . Financial resource strain: Not on file  . Food insecurity:    Worry: Not on file    Inability: Not on file  . Transportation needs:    Medical: Not on file    Non-medical: Not on file  Tobacco Use  . Smoking status: Never Smoker  . Smokeless tobacco: Never Used  Substance and Sexual Activity  . Alcohol use: No  . Drug use: No  . Sexual activity: Not on file  Lifestyle  . Physical activity:    Days per week: Not on file    Minutes per session: Not on file  . Stress: Not on file  Relationships  . Social connections:    Talks on phone: Not on file    Gets together: Not on file    Attends religious service: Not on file    Active member of club or organization: Not on file    Attends meetings of clubs or organizations: Not on file    Relationship status: Not on file  . Intimate partner violence:    Fear of current or ex partner: Not on file    Emotionally abused: Not on file    Physically abused: Not on file    Forced sexual activity: Not on file  Other Topics Concern  . Not on file  Social History Narrative   Marital status: single; not dating in 06/2015.      Children;  4 children; 2 adopted children; 13 grandchildren      Lives: alone, youngest daughter; all children in [place].      Employment: Midwife x 11 years.      Tobacco: none      Alcohol: none     Drugs; none      Exercise: no formal exercise outside of work.   Family History  Problem Relation Age of Onset  . Hypertension Mother   . Cancer Father 68       Lung Cancer  . Hypertension Sister   . Stroke Maternal Grandmother   . Heart disease Maternal Grandfather   . Cancer Paternal Grandfather        Objective:    BP (!) 162/88   Pulse 95   Temp 98 F (36.7 C) (Oral)   Resp 16   Ht 5' 7.32" (1.71 m)   Wt 267 lb (121.1 kg)   LMP 12/12/2011   SpO2 96%   BMI 41.42 kg/m  Physical Exam    Constitutional: She is oriented to person, place, and time. She appears well-developed and well-nourished. No distress.  HENT:  Head: Normocephalic and atraumatic.  Right Ear: External ear normal.  Left Ear: External ear normal.  Nose: Nose normal.  Mouth/Throat: Oropharynx is clear and moist.  Eyes: Pupils are equal, round, and reactive to light. Conjunctivae and EOM are normal.  Neck: Normal range of motion and full passive range of motion without pain. Neck supple. No JVD present. Carotid bruit is not present. No thyromegaly present.  Cardiovascular: Normal rate, regular rhythm, normal heart sounds and intact distal pulses. Exam reveals no gallop and no friction rub.  No murmur heard. Pulmonary/Chest: Effort normal and breath sounds normal. She has no wheezes. She has no rales.  Abdominal: Soft. Bowel sounds are normal. She exhibits no distension and no mass. There is no tenderness.  There is no rebound and no guarding.  Musculoskeletal:       Right shoulder: Normal.       Left shoulder: Normal.       Cervical back: Normal.  Lymphadenopathy:    She has no cervical adenopathy.  Neurological: She is alert and oriented to person, place, and time. She has normal reflexes. No cranial nerve deficit. She exhibits normal muscle tone. Coordination normal.  Skin: Skin is warm and dry. No rash noted. She is not diaphoretic. No erythema. No pallor.  Psychiatric: She has a normal mood and affect. Her behavior is normal. Judgment and thought content normal.  Nursing note and vitals reviewed.  No results found. [facility] 11/08/2017 05/19/2016 11/16/2015 07/06/2015 09/15/2014  Decreased Interest 0 0 0 0 0  Down, Depressed, Hopeless 0 0 0 0 0  PHQ - 2 Score 0 0 0 0 0   Fall Risk  11/08/2017 05/19/2016 11/16/2015 09/15/2014  Falls in the past year? No No No No        Assessment & Plan:   1. Essential hypertension   2. Lupus (HCC)   3. Class 3 severe obesity due to excess calories with  serious comorbidity and body mass index (BMI) of 40.0 to 44.9 in adult [facility](HCC)    Hypertension: Uncontrolled due to noncompliance with medication medical follow-up.  Multiple family stressors demanding her tension in the past year.  Obtain labs for chronic disease management.  Refills provided.  Lupus: In remission.  No active symptoms and no current treatment.  Morbid obesity:Recommend weight loss, exercise for 30-60 minutes five days per week; recommend 1200 kcal restriction per day with a minimum of 60 grams of protein per day.  Eat 3 meals per day. Do not skip meals. Consider having a protein shake as a meal replacement to aid with eliminating meal skipping. Look for products with <220 calories, <7 gm sugar, and 20-30 gm protein.  Eat breakfast within 2 hours of getting up.   Make  your plate non-starchy vegetables,  protein, and  carbohydrates at lunch and dinner.   Aim for at least 64 oz. of calorie-free beverages daily (water, Crystal Light, diet green tea, etc.). Eliminate any sugary beverages such as regular soda, sweet tea, or fruit juice.   Pay attention to hunger and fullness cues.  Stop eating once you feel satisfied; don't wait until you feel full, stuffed, or sick from eating.  Choose lean meats and low fat/fat free dairy products.  Choose foods high in fiber such as fruits, vegetables, and whole grains (brown rice, whole wheat pasta, whole wheat bread, etc.).  Limit foods with added sugar to <7 gm per serving.  Always eat in the kitchen/dining room.  Never eat in the bedroom or in front of the TV.    Orders Placed This Encounter  Procedures  . CBC with Differential/Platelet  . Comprehensive metabolic panel    Order Specific Question:   Has the patient fasted?    Answer:   No  . Lipid panel    Order Specific Question:   Has the patient fasted?    Answer:   No  . Urinalysis, dipstick only   Meds ordered this encounter  Medications  . losartan-hydrochlorothiazide  (HYZAAR) 100-25 MG tablet    Sig: Take 1 tablet by mouth daily.    Dispense:  90 tablet    Refill:  1  . amLODipine (NORVASC) 10 MG tablet    Sig: Take 1 tablet (10  mg total) by mouth daily.    Dispense:  90 tablet    Refill:  1    No follow-ups on file.   Adrina Armijo Paulita Fujita, M.D. Primary Care at [facility] [address] [place], [place]  [address] [phone] phone [phone] fax

## 2017-11-08 NOTE — Patient Instructions (Addendum)
   IF you received an x-ray today, you will receive an invoice from [facility]. Please contact [facility] at [phone] with questions or concerns regarding your invoice.   IF you received labwork today, you will receive an invoice from [facility]. Please contact [facility] at [phone] with questions or concerns regarding your invoice.   Our billing staff will not be able to assist you with questions regarding bills from these companies.  You will be contacted with the lab results as soon as they are available. The fastest way to get your results is to activate your My Chart account. Instructions are located on the last page of this paperwork. If you have not heard from us regarding the results in 2 weeks, please contact this office.      Managing Your Hypertension Hypertension is commonly called high blood pressure. This is when the force of your blood pressing against the walls of your arteries is too strong. Arteries are blood vessels that carry blood from your heart throughout your body. Hypertension forces the heart to work harder to pump blood, and may cause the arteries to become narrow or stiff. Having untreated or uncontrolled hypertension can cause heart attack, stroke, kidney disease, and other problems. What are blood pressure readings? A blood pressure reading consists of a higher number over a lower number. Ideally, your blood pressure should be below 120/80. The first ("top") number is called the systolic pressure. It is a measure of the pressure in your arteries as your heart beats. The second ("bottom") number is called the diastolic pressure. It is a measure of the pressure in your arteries as the heart relaxes. What does my blood pressure reading mean? Blood pressure is classified into four stages. Based on your blood pressure reading, your health care provider may use the following stages to determine what type of treatment you need, if any. Systolic  pressure and diastolic pressure are measured in a unit called mm Hg. Normal  Systolic pressure: below 120.  Diastolic pressure: below 80. Elevated  Systolic pressure: 120-129.  Diastolic pressure: below 80. Hypertension stage 1  Systolic pressure: 130-139.  Diastolic pressure: 80-89. Hypertension stage 2  Systolic pressure: 140 or above.  Diastolic pressure: 90 or above. What health risks are associated with hypertension? Managing your hypertension is an important responsibility. Uncontrolled hypertension can lead to:  A heart attack.  A stroke.  A weakened blood vessel (aneurysm).  Heart failure.  Kidney damage.  Eye damage.  Metabolic syndrome.  Memory and concentration problems.  What changes can I make to manage my hypertension? Hypertension can be managed by making lifestyle changes and possibly by taking medicines. Your health care provider will help you make a plan to bring your blood pressure within a normal range. Eating and drinking  Eat a diet that is high in fiber and potassium, and low in salt (sodium), added sugar, and fat. An example eating plan is called the DASH (Dietary Approaches to Stop Hypertension) diet. To eat this way: ? Eat plenty of fresh fruits and vegetables. Try to fill half of your plate at each meal with fruits and vegetables. ? Eat whole grains, such as whole wheat pasta, brown rice, or whole grain bread. Fill about one quarter of your plate with whole grains. ? Eat low-fat diary products. ? Avoid fatty cuts of meat, processed or cured meats, and poultry with skin. Fill about one quarter of your plate with lean proteins such as fish, chicken without skin, beans, eggs,   and tofu. ? Avoid premade and processed foods. These tend to be higher in sodium, added sugar, and fat.  Reduce your daily sodium intake. Most people with hypertension should eat less than 1,500 mg of sodium a day.  Limit alcohol intake to no more than 1 drink a day  for nonpregnant women and 2 drinks a day for men. One drink equals 12 oz of beer, 5 oz of wine, or 1 oz of hard liquor. Lifestyle  Work with your health care provider to maintain a healthy body weight, or to lose weight. Ask what an ideal weight is for you.  Get at least 30 minutes of exercise that causes your heart to beat faster (aerobic exercise) most days of the week. Activities may include walking, swimming, or biking.  Include exercise to strengthen your muscles (resistance exercise), such as weight lifting, as part of your weekly exercise routine. Try to do these types of exercises for 30 minutes at least 3 days a week.  Do not use any products that contain nicotine or tobacco, such as cigarettes and e-cigarettes. If you need help quitting, ask your health care provider.  Control any long-term (chronic) conditions you have, such as high cholesterol or diabetes. Monitoring  Monitor your blood pressure at home as told by your health care provider. Your personal target blood pressure may vary depending on your medical conditions, your age, and other factors.  Have your blood pressure checked regularly, as often as told by your health care provider. Working with your health care provider  Review all the medicines you take with your health care provider because there may be side effects or interactions.  Talk with your health care provider about your diet, exercise habits, and other lifestyle factors that may be contributing to hypertension.  Visit your health care provider regularly. Your health care provider can help you create and adjust your plan for managing hypertension. Will I need medicine to control my blood pressure? Your health care provider may prescribe medicine if lifestyle changes are not enough to get your blood pressure under control, and if:  Your systolic blood pressure is 130 or higher.  Your diastolic blood pressure is 80 or higher.  Take medicines only as told  by your health care provider. Follow the directions carefully. Blood pressure medicines must be taken as prescribed. The medicine does not work as well when you skip doses. Skipping doses also puts you at risk for problems. Contact a health care provider if:  You think you are having a reaction to medicines you have taken.  You have repeated (recurrent) headaches.  You feel dizzy.  You have swelling in your ankles.  You have trouble with your vision. Get help right away if:  You develop a severe headache or confusion.  You have unusual weakness or numbness, or you feel faint.  You have severe pain in your chest or abdomen.  You vomit repeatedly.  You have trouble breathing. Summary  Hypertension is when the force of blood pumping through your arteries is too strong. If this condition is not controlled, it may put you at risk for serious complications.  Your personal target blood pressure may vary depending on your medical conditions, your age, and other factors. For most people, a normal blood pressure is less than 120/80.  Hypertension is managed by lifestyle changes, medicines, or both. Lifestyle changes include weight loss, eating a healthy, low-sodium diet, exercising more, and limiting alcohol. This information is not intended to replace advice   given to you by your health care provider. Make sure you discuss any questions you have with your health care provider. Document Released: 02/17/2012 Document Revised: 04/22/2016 Document Reviewed: 04/22/2016 Elsevier Interactive Patient Education  2018 [employer]  

## 2017-11-09 LAB — COMPREHENSIVE METABOLIC PANEL
ALBUMIN: 3.9 g/dL (ref 3.5–5.5)
ALT: 12 IU/L (ref 0–32)
AST: 16 IU/L (ref 0–40)
Albumin/Globulin Ratio: 1.3 (ref 1.2–2.2)
Alkaline Phosphatase: 60 IU/L (ref 39–117)
BUN / CREAT RATIO: 14 (ref 9–23)
BUN: 13 mg/dL (ref 6–24)
Bilirubin Total: 0.3 mg/dL (ref 0.0–1.2)
CO2: 25 mmol/L (ref 20–29)
CREATININE: 0.91 mg/dL (ref 0.57–1.00)
Calcium: 9.1 mg/dL (ref 8.7–10.2)
Chloride: 103 mmol/L (ref 96–106)
GFR calc non Af Amer: 72 mL/min/{1.73_m2} (ref >59)
GFR, EST AFRICAN AMERICAN: 83 mL/min/{1.73_m2} (ref >59)
Globulin, Total: 3.1 g/dL (ref 1.5–4.5)
Glucose: 80 mg/dL (ref 65–99)
Potassium: 4 mmol/L (ref 3.5–5.2)
Sodium: 142 mmol/L (ref 134–144)
TOTAL PROTEIN: 7 g/dL (ref 6.0–8.5)

## 2017-11-09 LAB — CBC WITH DIFFERENTIAL/PLATELET
BASOS: 1 %
Basophils Absolute: 0 10*3/uL (ref 0.0–0.2)
EOS (ABSOLUTE): 0.1 10*3/uL (ref 0.0–0.4)
EOS: 2 %
HEMATOCRIT: 37 % (ref 34.0–46.6)
HEMOGLOBIN: 12.2 g/dL (ref 11.1–15.9)
Immature Grans (Abs): 0 10*3/uL (ref 0.0–0.1)
Immature Granulocytes: 0 %
LYMPHS ABS: 2.8 10*3/uL (ref 0.7–3.1)
Lymphs: 47 %
MCH: 30.3 pg (ref 26.6–33.0)
MCHC: 33 g/dL (ref 31.5–35.7)
MCV: 92 fL (ref 79–97)
MONOCYTES: 10 %
Monocytes Absolute: 0.6 10*3/uL (ref 0.1–0.9)
NEUTROS ABS: 2.3 10*3/uL (ref 1.4–7.0)
Neutrophils: 40 %
Platelets: 348 10*3/uL (ref 150–450)
RBC: 4.02 x10E6/uL (ref 3.77–5.28)
RDW: 13.5 % (ref 12.3–15.4)
WBC: 5.8 10*3/uL (ref 3.4–10.8)

## 2017-11-09 LAB — LIPID PANEL
CHOLESTEROL TOTAL: 161 mg/dL (ref 100–199)
Chol/HDL Ratio: 2.9 ratio (ref 0.0–4.4)
HDL: 55 mg/dL (ref >39)
LDL CALC: 82 mg/dL (ref 0–99)
Triglycerides: 122 mg/dL (ref 0–149)
VLDL CHOLESTEROL CAL: 24 mg/dL (ref 5–40)

## 2017-11-09 LAB — URINALYSIS, DIPSTICK ONLY
BILIRUBIN UA: NEGATIVE
GLUCOSE, UA: NEGATIVE
Nitrite, UA: NEGATIVE
Specific Gravity, UA: >=1.03 — AB (ref 1.005–1.030)
Urobilinogen, Ur: 0.2 mg/dL (ref 0.2–1.0)
pH, UA: 5.5 (ref 5.0–7.5)

## 2018-01-08 ENCOUNTER — Encounter: Payer: Self-pay | Admitting: Family Medicine
